# Patient Record
Sex: Female | Born: 1990 | Hispanic: No | Marital: Married | Smoking: Never smoker
Health system: Southern US, Community
[De-identification: ages and names within clinical notes are randomized; demographics above are authoritative.]

## PROBLEM LIST (undated history)

## (undated) DIAGNOSIS — T8859XA Other complications of anesthesia, initial encounter: Secondary | ICD-10-CM

## (undated) DIAGNOSIS — Z789 Other specified health status: Secondary | ICD-10-CM

## (undated) HISTORY — DX: Other complications of anesthesia, initial encounter: T88.59XA

## (undated) HISTORY — PX: NO PAST SURGERIES: SHX2092

---

## 2019-08-31 ENCOUNTER — Encounter: Payer: Self-pay | Admitting: Family Medicine

## 2019-08-31 DIAGNOSIS — Z0289 Encounter for other administrative examinations: Secondary | ICD-10-CM | POA: Insufficient documentation

## 2019-08-31 HISTORY — DX: Encounter for other administrative examinations: Z02.89

## 2019-09-01 ENCOUNTER — Ambulatory Visit (INDEPENDENT_AMBULATORY_CARE_PROVIDER_SITE_OTHER): Payer: Medicaid Other | Admitting: Family Medicine

## 2019-09-01 ENCOUNTER — Other Ambulatory Visit (HOSPITAL_COMMUNITY)
Admission: RE | Admit: 2019-09-01 | Discharge: 2019-09-01 | Disposition: A | Discharge status: Home / Self Care | Payer: Medicaid Other | Source: Ambulatory Visit | Attending: Family Medicine | Admitting: Family Medicine

## 2019-09-01 ENCOUNTER — Other Ambulatory Visit: Payer: Self-pay

## 2019-09-01 VITALS — BP 94/60 | HR 69 | Ht 64.0 in | Wt 124.0 lb

## 2019-09-01 DIAGNOSIS — N926 Irregular menstruation, unspecified: Secondary | ICD-10-CM | POA: Diagnosis not present

## 2019-09-01 DIAGNOSIS — Z0289 Encounter for other administrative examinations: Secondary | ICD-10-CM | POA: Insufficient documentation

## 2019-09-01 DIAGNOSIS — N3001 Acute cystitis with hematuria: Secondary | ICD-10-CM | POA: Diagnosis not present

## 2019-09-01 DIAGNOSIS — K5909 Other constipation: Secondary | ICD-10-CM | POA: Diagnosis not present

## 2019-09-01 LAB — POCT URINALYSIS DIP (MANUAL ENTRY)
Bilirubin, UA: NEGATIVE
Glucose, UA: NEGATIVE mg/dL
Ketones, POC UA: NEGATIVE mg/dL
Nitrite, UA: NEGATIVE
Protein Ur, POC: NEGATIVE mg/dL
Spec Grav, UA: 1.01 (ref 1.010–1.025)
Urobilinogen, UA: 0.2 E.U./dL
pH, UA: 6.5 (ref 5.0–8.0)

## 2019-09-01 LAB — POCT URINE PREGNANCY: Preg Test, Ur: NEGATIVE

## 2019-09-01 LAB — POCT UA - MICROSCOPIC ONLY

## 2019-09-01 MED ORDER — CEPHALEXIN 500 MG PO CAPS: 500.0000 mg | ORAL_CAPSULE | Freq: Three times a day (TID) | ORAL | 0 refills | Status: DC

## 2019-09-01 MED ORDER — POLYETHYLENE GLYCOL 3350 17 G PO PACK: 17.0000 g | PACK | Freq: Every day | ORAL | 3 refills | Status: DC

## 2019-09-01 MED ORDER — PRENATAL 19 29-1 MG PO TABS: ORAL_TABLET | ORAL | 3 refills | Status: DC

## 2019-09-01 NOTE — Assessment & Plan Note (Signed)
Desires another child. Last received Depo in May 2021.  Discussed preconception testing and nutrition. Discussed and recommended PNV. Labs as above today.   Has had a history of irregular menses--- only while on Depo-Provera, suspect this is cause. Less likely thyroid disease or PCOS given normal exam.

## 2019-09-01 NOTE — Patient Instructions (Addendum)
It was wonderful to see you today.  Please bring ALL of your medications with you to every visit.   Today we talked about:  - Starting an antibiotic for a urine infection--please pick this up at your pharmacy today  - Take a prenatal every day   At your next visit we will do a women's exam (pelvic)  This visit is August 16th   Thank you for choosing [facility].   Please call [phone] with any questions about today's appointment.  Please be sure to schedule follow up at the front  desk before you leave today.   Terisa Starr, MD  Family Medicine

## 2019-09-01 NOTE — Progress Notes (Signed)
Patient Name: Kara Monroe Date of Birth: Feb 13, 1991 Date of Visit: 09/01/19 PCP: Westley Chandler, MD  Chief Complaint: refugee intake examination and lower abdominal pain X 2 weeks  The patient's preferred language is Kinyarwanda. An interpreter was used for the entire visit.  Interpreter Name or ID: Zara Chess 3    Subjective: Kara Monroe is a pleasant 29 y.o. presenting today for an initial refugee and immigrant clinic visit.    Lower abdominal pain Patient reports a 1-2 week history of intermittent lower abdominal pain, dysuria, and frequency. Has had intermittent stable vaginal discharge. Reports a remote history of UTI in past. Eating and drinking well. Pain worse with palpation. Not changed by defecation. Reports a history of hard stools. Denies fevers, back pain, vomiting, hematochezia, or melena.   Menses/Contraception Patient reports a history of irregular menses, she attributes to Depo Provera. Had regular menses until she had second child and started Depo after delivery. Desires another child in next 1-2 years. She is planning to stop Depo (last dose in May). No nausea, vomiting. No discharge today. Unsure of her LMP as she has only light spotting most of the time with Depo Provera. No history of excess hair growth, acne, signs or symptoms of thyroid disease.   Ob history: Has had two term vaginal deliveries No issues with bleeding, lacerations, LGA No issues with HTN or diabetes Had one miscarriage after 7 year old daughter around 12-16 weeks   ROS: negative for HA, vision changes, nausea, vomiting, positive for constipation. Negative for fevers, back pain, chest pain, dyspnea.    PMH: Irregular menses  PSH: None   FH: Parents healthy, arriving in [place] next week   Current Medications:  Depo Provera   Refugee Information Number of Immediate Family Members: 4 Number of Immediate Family Members in [place]: 4 Date of Arrival: 08/12/19 Country of Birth:  [facility] Country of Origin:  ([place]) Location of Refugee Camp:  ([place]) Duration in [place]: 16-19 years Reason for Leaving Home Country: Political opinion Primary Language: Kinyarwanda/Rwanda, [place], Swahili/Kiswahili, English Able to Read in Primary Language: Yes Able to Write in Primary Language: Yes (Swahili and [place]) Education: [employer] Marital Status: Married Sexual Activity: Yes Tuberculosis Screening Overseas: Negative Tuberculosis Screening Health Department: Not Completed Health Department Labs Completed: No History of Trauma: None Do You Feel Jumpy or Nervous?: No Are You Very Watchful or 'Super Alert'?: No   Date of Overseas Exam: 04/27/2019 Review of Overseas Exam: Completed, unremarkable Pre-Departure Treatment: Coartem/Albendazole/Praziquantel    Vitals:   09/01/19 0934  BP: 94/60  Pulse: 69  SpO2: 100%   HEENT: Sclera anicteric. Dentition is MOderate . Appears well hydrated. Neck: Supple Cardiac: Regular rate and rhythm. Normal S1/S2. No murmurs, rubs, or gallops appreciated. Lungs: Clear bilaterally to ascultation.  Abdomen: Normoactive bowel sounds. No tenderness to deep or light palpation. No rebound or guarding. Splenomegaly- none, no masses No suprapubic tenderness, no flank pain   Extremities: Warm, well perfused without edema.  Skin: warm, no jaundice   Psych: Pleasant and appropriate  MSK: full ROM of UE and LE    Zweig was seen today for establish care.  Diagnoses and all orders for this visit:  Refugee health examination -     Strongyloides, Ab, IgG -     HCV Ab w Reflex to Quant PCR -     RPR -     HIV Antibody (routine testing w rflx) -     CBC with Differential/Platelet -  Comprehensive metabolic panel -     Varicella zoster antibody, IgG -     Hepatitis B surface antigen -     POCT urinalysis dipstick -     Lipid panel -     TSH -     POCT urine pregnancy -     Urine cytology ancillary only -     POCT UA - Microscopic  Only -     Hgb Fractionation Cascade -     Prenatal Vit-DSS-Fe Fum-FA (PRENATAL 19) 29-1 MG TABS; Take one pill by mouth daily  Acute cystitis with hematuria, suspect this as cause. No signs of pyelonephritis. Consider cervicitis, pelvic with Pap at follow up.  -     cephALEXin (KEFLEX) 500 MG capsule; Take 1 capsule (500 mg total) by mouth 3 (three) times daily. -     Urine Culture  Intermittent constipation -     polyethylene glycol (MIRALAX) 17 g packet; Take 17 g by mouth daily.  Irregular menses Desires another child. Last received Depo in May 2021.  Discussed preconception testing and nutrition. Discussed and recommended PNV. Labs as above today.   Has had a history of irregular menses--- only while on Depo-Provera, suspect this is cause. Less likely thyroid disease or PCOS given normal exam.   Return to care in 1 month in [facility] with resident physician and PCP.

## 2019-09-02 LAB — URINE CYTOLOGY ANCILLARY ONLY
Chlamydia: NEGATIVE
Comment: NEGATIVE
Comment: NORMAL
Neisseria Gonorrhea: NEGATIVE

## 2019-09-03 LAB — HGB FRACTIONATION CASCADE
Hgb A2: 2.9 % (ref 1.8–3.2)
Hgb A: 97.1 % (ref 96.4–98.8)
Hgb F: 0 % (ref 0.0–2.0)
Hgb S: 0 %

## 2019-09-03 LAB — COMPREHENSIVE METABOLIC PANEL
ALT: 13 IU/L (ref 0–32)
AST: 14 IU/L (ref 0–40)
Albumin/Globulin Ratio: 1.8 (ref 1.2–2.2)
Albumin: 4.5 g/dL (ref 3.8–4.8)
Alkaline Phosphatase: 56 IU/L (ref 48–121)
BUN/Creatinine Ratio: 12 (ref 9–23)
BUN: 8 mg/dL (ref 6–20)
Bilirubin Total: 0.5 mg/dL (ref 0.0–1.2)
CO2: 20 mmol/L (ref 20–29)
Calcium: 9.6 mg/dL (ref 8.7–10.2)
Chloride: 103 mmol/L (ref 96–106)
Creatinine, Ser: 0.66 mg/dL (ref 0.57–1.00)
GFR calc Af Amer: 136 mL/min/{1.73_m2} (ref >59)
GFR calc non Af Amer: 118 mL/min/{1.73_m2} (ref >59)
Globulin, Total: 2.5 g/dL (ref 1.5–4.5)
Glucose: 71 mg/dL (ref 65–99)
Potassium: 4.2 mmol/L (ref 3.5–5.2)
Sodium: 140 mmol/L (ref 134–144)
Total Protein: 7 g/dL (ref 6.0–8.5)

## 2019-09-03 LAB — CBC WITH DIFFERENTIAL/PLATELET
Basophils Absolute: 0.1 10*3/uL (ref 0.0–0.2)
Basos: 1 %
EOS (ABSOLUTE): 0.1 10*3/uL (ref 0.0–0.4)
Eos: 2 %
Hematocrit: 41.7 % (ref 34.0–46.6)
Hemoglobin: 13.9 g/dL (ref 11.1–15.9)
Immature Grans (Abs): 0 10*3/uL (ref 0.0–0.1)
Immature Granulocytes: 0 %
Lymphocytes Absolute: 2.4 10*3/uL (ref 0.7–3.1)
Lymphs: 45 %
MCH: 31.3 pg (ref 26.6–33.0)
MCHC: 33.3 g/dL (ref 31.5–35.7)
MCV: 94 fL (ref 79–97)
Monocytes Absolute: 0.5 10*3/uL (ref 0.1–0.9)
Monocytes: 11 %
Neutrophils Absolute: 2.1 10*3/uL (ref 1.4–7.0)
Neutrophils: 41 %
Platelets: 258 10*3/uL (ref 150–450)
RBC: 4.44 x10E6/uL (ref 3.77–5.28)
RDW: 11.9 % (ref 11.7–15.4)
WBC: 5.2 10*3/uL (ref 3.4–10.8)

## 2019-09-03 LAB — RPR: RPR Ser Ql: NONREACTIVE

## 2019-09-03 LAB — VARICELLA ZOSTER ANTIBODY, IGG: Varicella zoster IgG: 670 index (ref >165)

## 2019-09-03 LAB — HEPATITIS B SURFACE ANTIGEN: Hepatitis B Surface Ag: NEGATIVE

## 2019-09-03 LAB — HCV AB W REFLEX TO QUANT PCR: HCV Ab: <0.1 s/co ratio (ref 0.0–0.9)

## 2019-09-03 LAB — LIPID PANEL
Chol/HDL Ratio: 3.3 ratio (ref 0.0–4.4)
Cholesterol, Total: 134 mg/dL (ref 100–199)
HDL: 41 mg/dL (ref >39)
LDL Chol Calc (NIH): 72 mg/dL (ref 0–99)
Triglycerides: 116 mg/dL (ref 0–149)
VLDL Cholesterol Cal: 21 mg/dL (ref 5–40)

## 2019-09-03 LAB — HCV INTERPRETATION

## 2019-09-03 LAB — HIV ANTIBODY (ROUTINE TESTING W REFLEX): HIV Screen 4th Generation wRfx: NONREACTIVE

## 2019-09-03 LAB — TSH: TSH: 5.16 u[IU]/mL — ABNORMAL HIGH (ref 0.450–4.500)

## 2019-09-05 LAB — URINE CULTURE

## 2019-09-06 LAB — STRONGYLOIDES, AB, IGG

## 2019-09-08 ENCOUNTER — Telehealth: Payer: Self-pay | Admitting: Family Medicine

## 2019-09-08 LAB — STRONGYLOIDES, AB, IGG: Strongyloides, Ab, IgG: NEGATIVE

## 2019-09-08 LAB — SPECIMEN STATUS REPORT

## 2019-09-08 NOTE — Telephone Encounter (Signed)
The patient speaks Kara Monroe as their primary language.  An interpreter was used for the entire visit.   Called patient with results. Discussed slightly elevated TSH, recommend repeat with T4 and T3 at follow up. Otherwise, labs normal. She is completing antibiotics.   Strongyloides negative. All questions answered.   Terisa Starr, MD  Family Medicine Teaching Service

## 2019-10-04 ENCOUNTER — Ambulatory Visit (INDEPENDENT_AMBULATORY_CARE_PROVIDER_SITE_OTHER): Payer: Medicaid Other | Admitting: Family Medicine

## 2019-10-04 ENCOUNTER — Other Ambulatory Visit (HOSPITAL_COMMUNITY)
Admission: RE | Admit: 2019-10-04 | Discharge: 2019-10-04 | Disposition: A | Discharge status: Home / Self Care | Payer: Medicaid Other | Source: Ambulatory Visit | Attending: Family Medicine | Admitting: Family Medicine

## 2019-10-04 ENCOUNTER — Other Ambulatory Visit: Payer: Self-pay

## 2019-10-04 ENCOUNTER — Encounter: Payer: Self-pay | Admitting: Family Medicine

## 2019-10-04 VITALS — BP 92/62 | HR 60 | Wt 126.0 lb

## 2019-10-04 DIAGNOSIS — R300 Dysuria: Secondary | ICD-10-CM | POA: Diagnosis not present

## 2019-10-04 DIAGNOSIS — Z124 Encounter for screening for malignant neoplasm of cervix: Secondary | ICD-10-CM | POA: Insufficient documentation

## 2019-10-04 DIAGNOSIS — E039 Hypothyroidism, unspecified: Secondary | ICD-10-CM | POA: Diagnosis present

## 2019-10-04 DIAGNOSIS — N3000 Acute cystitis without hematuria: Secondary | ICD-10-CM

## 2019-10-04 DIAGNOSIS — R1031 Right lower quadrant pain: Secondary | ICD-10-CM | POA: Diagnosis not present

## 2019-10-04 DIAGNOSIS — E038 Other specified hypothyroidism: Secondary | ICD-10-CM

## 2019-10-04 LAB — POCT WET PREP (WET MOUNT)
Clue Cells Wet Prep Whiff POC: NEGATIVE
Trichomonas Wet Prep HPF POC: ABSENT

## 2019-10-04 LAB — POCT URINALYSIS DIP (MANUAL ENTRY)
Bilirubin, UA: NEGATIVE
Glucose, UA: NEGATIVE mg/dL
Ketones, POC UA: NEGATIVE mg/dL
Nitrite, UA: NEGATIVE
Protein Ur, POC: NEGATIVE mg/dL
Spec Grav, UA: 1.015 (ref 1.010–1.025)
Urobilinogen, UA: 0.2 E.U./dL
pH, UA: 8.5 — AB (ref 5.0–8.0)

## 2019-10-04 LAB — POCT UA - MICROSCOPIC ONLY: WBC, Ur, HPF, POC: >20

## 2019-10-04 LAB — POCT URINE PREGNANCY: Preg Test, Ur: NEGATIVE

## 2019-10-04 MED ORDER — CEPHALEXIN 500 MG PO CAPS: 500.0000 mg | ORAL_CAPSULE | Freq: Four times a day (QID) | ORAL | 0 refills | Status: DC

## 2019-10-04 NOTE — Patient Instructions (Addendum)
It was wonderful to see you today.  Please bring ALL of your medications with you to every visit.   Today we talked about:  - Your pregnancy test is negative  - I think you may have an infection---I would like to do some blood work   - I will call you with results of your urine and blood testing  - I recommend taking a prenatal    Follow up in 4-6 months    Thank you for choosing [facility].   Please call [phone] with any questions about today's appointment.  Please be sure to schedule follow up at the front  desk before you leave today.   Terisa Starr, MD  Family Medicine

## 2019-10-04 NOTE — Progress Notes (Signed)
° ° °  SUBJECTIVE:   CHIEF COMPLAINT / HPI:   The patient speaks Kinyarwanda as their primary language.  An interpreter was used for the entire visit.   Kara Monroe is a 29 year old presenting with suprapubic pain and follow up.  Lab Review Notable for mild elevation in TSH. Denies signs or symptoms of thyroid deficiency. Amenable to repeat.   Recent UTI Patient reports cephalexin was completed and she had improvement in urinary symptoms. Last Tuesday, pain began again with associated spotting. Pain is intermittent, worse with urination, located in suprapubic and RLQ area. No nausea, vomiting, fevers. She denies back pain. Endorses poorer appetite. She denies prior recurrent UTI in past---only since coming to [place] has she had recurrence.    PERTINENT  PMH / PSH/Family/Social History : Reviewed and updated   OBJECTIVE:   BP 92/62    Pulse 60    Wt 126 lb (57.2 kg)    SpO2 98%    BMI 21.63 kg/m   HEENT: Sclera anicteric. Dentition is moderate. Appears well hydrated. Neck: Supple Cardiac: Regular rate and rhythm. Normal S1/S2. No murmurs, rubs, or gallops appreciated. Lungs: Clear bilaterally to ascultation.  Abdomen: Normoactive bowel sounds. Mild suprapubic and RLQ tenderness GU Exam:  Chaperoned exam.  External exam: Normal-appearing female external genitalia.  Vaginal exam notable for moderate discharge.  Cervix with + transformation zone  Bimanual exam reveals normal sized uterus no masses appreciated, no pain with examination, no RLQ pain  Extremities: Warm, well perfused without edema.  Skin: Warm, dry Psych: Pleasant and appropriate    ASSESSMENT/PLAN:   Suprapubic pain, most concerning for UTI, also considered appendicitis (eating well, less likely, will obtain labs as below), pyelonephritis (no CVA tenderness, less likely), no RLQ pain on pelvic making torsion less likely. Ovarian cyst is also possible. Send for urine culture, will empirically treat, at follow up  discuss ways to prevent UTI. CBC with differential and CMP to assess for intra-abdominal pathology given recurrence and pain on initial exam.   Subclinical Hypothyroidism  TSH, T4, T3 today   HCM Pap performed today   Terisa Starr, MD  Family Medicine Teaching Service  [facility]

## 2019-10-05 LAB — COMPREHENSIVE METABOLIC PANEL
ALT: 21 IU/L (ref 0–32)
AST: 27 IU/L (ref 0–40)
Albumin/Globulin Ratio: 1.6 (ref 1.2–2.2)
Albumin: 4.2 g/dL (ref 3.8–4.8)
Alkaline Phosphatase: 59 IU/L (ref 48–121)
BUN/Creatinine Ratio: 11 (ref 9–23)
BUN: 5 mg/dL — ABNORMAL LOW (ref 6–20)
Bilirubin Total: 0.5 mg/dL (ref 0.0–1.2)
CO2: 19 mmol/L — ABNORMAL LOW (ref 20–29)
Calcium: 9.3 mg/dL (ref 8.7–10.2)
Chloride: 106 mmol/L (ref 96–106)
Creatinine, Ser: 0.47 mg/dL — ABNORMAL LOW (ref 0.57–1.00)
GFR calc Af Amer: 152 mL/min/{1.73_m2} (ref >59)
GFR calc non Af Amer: 132 mL/min/{1.73_m2} (ref >59)
Globulin, Total: 2.6 g/dL (ref 1.5–4.5)
Glucose: 73 mg/dL (ref 65–99)
Potassium: 4 mmol/L (ref 3.5–5.2)
Sodium: 138 mmol/L (ref 134–144)
Total Protein: 6.8 g/dL (ref 6.0–8.5)

## 2019-10-05 LAB — CBC WITH DIFFERENTIAL/PLATELET
Basophils Absolute: 0 10*3/uL (ref 0.0–0.2)
Basos: 1 %
EOS (ABSOLUTE): 0.1 10*3/uL (ref 0.0–0.4)
Eos: 2 %
Hematocrit: 37.2 % (ref 34.0–46.6)
Hemoglobin: 12.5 g/dL (ref 11.1–15.9)
Immature Grans (Abs): 0 10*3/uL (ref 0.0–0.1)
Immature Granulocytes: 0 %
Lymphocytes Absolute: 1.7 10*3/uL (ref 0.7–3.1)
Lymphs: 36 %
MCH: 31.1 pg (ref 26.6–33.0)
MCHC: 33.6 g/dL (ref 31.5–35.7)
MCV: 93 fL (ref 79–97)
Monocytes Absolute: 0.5 10*3/uL (ref 0.1–0.9)
Monocytes: 9 %
Neutrophils Absolute: 2.5 10*3/uL (ref 1.4–7.0)
Neutrophils: 52 %
Platelets: 216 10*3/uL (ref 150–450)
RBC: 4.02 x10E6/uL (ref 3.77–5.28)
RDW: 12.7 % (ref 11.7–15.4)
WBC: 4.8 10*3/uL (ref 3.4–10.8)

## 2019-10-05 LAB — CYTOLOGY - PAP
Chlamydia: NEGATIVE
Comment: NEGATIVE
Comment: NEGATIVE
Comment: NORMAL
Diagnosis: NEGATIVE
High risk HPV: NEGATIVE
Neisseria Gonorrhea: NEGATIVE

## 2019-10-05 LAB — T4, FREE: Free T4: 1.09 ng/dL (ref 0.82–1.77)

## 2019-10-05 LAB — T3: T3, Total: 109 ng/dL (ref 71–180)

## 2019-10-05 LAB — TSH: TSH: 4.36 u[IU]/mL (ref 0.450–4.500)

## 2019-10-06 ENCOUNTER — Telehealth: Payer: Self-pay | Admitting: Family Medicine

## 2019-10-06 LAB — URINE CULTURE

## 2019-10-06 NOTE — Telephone Encounter (Signed)
Called patient with [facility] [place] interpreter.  Urine culture returned- negative Pap smear was normal- repeat 5 years, discussed.   All other labs normal, no indication for infection. Differential still includes ovarian cyst. She reports she feels better. No need to fill medication. All questions.   Terisa Starr, MD  Family Medicine Teaching Service

## 2019-11-12 ENCOUNTER — Ambulatory Visit (INDEPENDENT_AMBULATORY_CARE_PROVIDER_SITE_OTHER): Payer: Medicaid Other | Admitting: Family Medicine

## 2019-11-12 ENCOUNTER — Other Ambulatory Visit: Payer: Self-pay

## 2019-11-12 ENCOUNTER — Encounter: Payer: Self-pay | Admitting: Family Medicine

## 2019-11-12 VITALS — BP 105/78 | Wt 124.0 lb

## 2019-11-12 DIAGNOSIS — G8929 Other chronic pain: Secondary | ICD-10-CM | POA: Diagnosis not present

## 2019-11-12 DIAGNOSIS — R102 Pelvic and perineal pain: Secondary | ICD-10-CM

## 2019-11-12 DIAGNOSIS — G894 Chronic pain syndrome: Secondary | ICD-10-CM

## 2019-11-12 LAB — POCT URINE PREGNANCY: Preg Test, Ur: NEGATIVE

## 2019-11-12 NOTE — Assessment & Plan Note (Addendum)
Differential includes ovarian cyst, leiomyoma, painful bladder syndrome. Although symptoms consistent with UTI, prior cultures negative. Will repeat today. Unlikely to be nephrolithiasis. Also consider Schistosoma given pyuria, negative culture. Trichomonas also possible (negative testing previously). GC/CT also negative.   Review of prior cultures--- one with E. Coli, treated. Follow up culture negative. Consider urine micro at follow up--had pyuria on last evaluation with negative culture (sterile pyuria, although some epithelial cells).   Plan today: - Repeat culture, obtain microscopy at follow up - Pelvic ultrasound to evaluate for cyst/leiomyoma

## 2019-11-12 NOTE — Progress Notes (Signed)
    SUBJECTIVE:   CHIEF COMPLAINT / HPI:   The patient speaks Kinyarwanda as their primary language.  An interpreter was used for the entire visit.   Kara Monroe is a pleasatn 29 year old woman with history significant for chronic pelvic pain. This started prior to arrival in [place] but now has occurred every month. Typically occurs in week leading up to menses. Often right sided, associated with dysuria and suprapubic pain. No frequency, urgency, hesitation, or hematuria. She is sexually active with her husband. She is not using contraception as she desires another child. LMP 8/24.   The patient is not working currently. She has 2 healthy children. Her and her husband are doing well. She wishes to wait to receive her vaccine for influenza with [facility].   PERTINENT  PMH / PSH/Family/Social History : Updated and reviewed  OBJECTIVE:   BP 105/78 (BP Location: Left Arm)   Wt 124 lb (56.2 kg)   BMI 21.28 kg/m   HEENT: Sclera anicteric. Dentition is moderate. Appears well hydrated. Neck: Supple Cardiac: Regular rate and rhythm. Normal S1/S2. No murmurs, rubs, or gallops appreciated. Lungs: Clear bilaterally to ascultation.  Abdomen: Normoactive bowel sounds. No tenderness to deep or light palpation. No rebound or guarding.  Extremities: Warm, well perfused without edema.  Skin: Warm, dry Psych: Pleasant and appropriate    ASSESSMENT/PLAN:   Chronic pelvic pain in female Differential includes ovarian cyst, leiomyoma, painful bladder syndrome. Although symptoms consistent with UTI, prior cultures negative. Will repeat today. Unlikely to be nephrolithiasis. Also consider Schistosoma given pyuria, negative culture. Trichomonas also possible (negative testing previously). GC/CT also negative.   Review of prior cultures--- one with E. Coli, treated. Follow up culture negative. Consider urine micro at follow up--had pyuria on last evaluation with negative culture (sterile pyuria,  although some epithelial cells).   Plan today: - Repeat culture, obtain microscopy at follow up - Pelvic ultrasound to evaluate for cyst/leiomyoma     HCM - Will receive influenza at [facility]   Terisa Starr, MD  Family Medicine Teaching Service  [facility]

## 2019-11-12 NOTE — Patient Instructions (Addendum)
It was wonderful to see you today.  Please bring ALL of your medications with you to every visit.    - Checking an ultrasound for your pain    Thank you for choosing [facility].   Please call [phone] with any questions about today's appointment.  Please be sure to schedule follow up at the front  desk before you leave today.   Terisa Starr, MD  Family Medicine

## 2019-11-15 LAB — URINE CULTURE

## 2019-11-17 ENCOUNTER — Encounter: Payer: Self-pay | Admitting: Family Medicine

## 2019-11-18 ENCOUNTER — Ambulatory Visit: Admission: RE | Admit: 2019-11-18 | Payer: Medicaid Other | Source: Ambulatory Visit

## 2019-12-01 ENCOUNTER — Inpatient Hospital Stay: Admission: RE | Admit: 2019-12-01 | Payer: Medicaid Other | Source: Ambulatory Visit

## 2019-12-15 ENCOUNTER — Inpatient Hospital Stay: Admission: RE | Admit: 2019-12-15 | Payer: Medicaid Other | Source: Ambulatory Visit

## 2019-12-27 ENCOUNTER — Ambulatory Visit
Admission: RE | Admit: 2019-12-27 | Discharge: 2019-12-27 | Disposition: A | Discharge status: Home / Self Care | Payer: Medicaid Other | Source: Ambulatory Visit | Attending: Family Medicine | Admitting: Family Medicine

## 2019-12-27 ENCOUNTER — Ambulatory Visit: Payer: Medicaid Other | Admitting: Family Medicine

## 2019-12-27 ENCOUNTER — Other Ambulatory Visit: Payer: Self-pay

## 2019-12-27 DIAGNOSIS — G894 Chronic pain syndrome: Secondary | ICD-10-CM | POA: Diagnosis not present

## 2019-12-28 ENCOUNTER — Other Ambulatory Visit: Payer: Medicaid Other

## 2019-12-29 ENCOUNTER — Encounter: Payer: Self-pay | Admitting: Family Medicine

## 2019-12-29 ENCOUNTER — Telehealth: Payer: Self-pay | Admitting: Family Medicine

## 2019-12-29 NOTE — Telephone Encounter (Signed)
Called patient with [facility] interpreter.   Left generic voicemail to call back. She needs repeat ultrasound in 6-12 weeks. Will also send letter.  Terisa Starr, MD  Family Medicine Teaching Service

## 2020-01-05 ENCOUNTER — Telehealth: Payer: Self-pay | Admitting: Family Medicine

## 2020-01-05 NOTE — Telephone Encounter (Signed)
The patient speaks [name] as their primary language.  An interpreter was used for the entire visit.   Left generic voicemail to call back---if patient calls back please schedule with me in clinic in next 1-2 months.  Terisa Starr, MD  Family Medicine Teaching Service

## 2020-02-19 NOTE — L&D Delivery Note (Signed)
Obstetrical Delivery Note   Date of Delivery:   01/12/2021 Primary OB:   [facility] Gestational Age/EDD: [redacted]w[redacted]d Reason for Admission: Active labor Antepartum complications: none  Delivered By:   Cornelia Copa. MD  Delivery Type:   spontaneous vaginal delivery  Delivery Details:   I was called to see patient for BBOW. On my arrival, patient was +3 with BBOW that was ruptured with moderate meconium. With the second contraction, patient easily delivered baby. Loose nuchal x 1 reduced prior to delivery. Anesthesia:    epidural Intrapartum complications: None GBS:    Negative Laceration:    1st degree (repaired with 3-0 vicryl) Episiotomy:    none Rectal exam:   deferred Placenta:    Delivered and expressed via active management. Intact: yes. To pathology: no.  Delayed Cord Clamping: yes Estimated Blood Loss:   Baby:    Liveborn female, APGARs 9/9, weight 3460gm  Cornelia Copa. MD Attending Center for [facility])

## 2020-04-19 ENCOUNTER — Ambulatory Visit (HOSPITAL_COMMUNITY)
Admission: EM | Admit: 2020-04-19 | Discharge: 2020-04-19 | Disposition: A | Discharge status: Home / Self Care | Payer: Medicaid Other | Attending: Family Medicine | Admitting: Family Medicine

## 2020-04-19 ENCOUNTER — Encounter (HOSPITAL_COMMUNITY): Payer: Self-pay

## 2020-04-19 ENCOUNTER — Other Ambulatory Visit: Payer: Self-pay

## 2020-04-19 DIAGNOSIS — N760 Acute vaginitis: Secondary | ICD-10-CM

## 2020-04-19 DIAGNOSIS — N898 Other specified noninflammatory disorders of vagina: Secondary | ICD-10-CM

## 2020-04-19 LAB — POCT URINALYSIS DIPSTICK, ED / UC
Bilirubin Urine: NEGATIVE
Glucose, UA: NEGATIVE mg/dL
Leukocytes,Ua: NEGATIVE
Nitrite: NEGATIVE
Protein, ur: NEGATIVE mg/dL
Specific Gravity, Urine: 1.025 (ref 1.005–1.030)
Urobilinogen, UA: 0.2 mg/dL (ref 0.0–1.0)
pH: 5.5 (ref 5.0–8.0)

## 2020-04-19 NOTE — ED Triage Notes (Signed)
Pt in with c/o vaginal irritation and discharge that has been going on for 2 weeks now  Pt has not had medication for sxs

## 2020-04-19 NOTE — ED Provider Notes (Signed)
[facility]    CSN: [id number] Arrival date & time: 04/19/20  1008      History   Chief Complaint Chief Complaint  Patient presents with  . Vaginal Discharge    HPI Kara Monroe is a 30 y.o. female.   Medical interpreter used today to facilitate visit with patient's consent.  She is complaining of about 2 weeks of vaginal discharge itching and irritation as well as some mild suprapubic tenderness.  Denies fever, chills, nausea, vomiting, diarrhea, pelvic pain, hematuria, dysuria, urinary frequency.  States she was treated very recently for a vaginal infection with mild temporary relief but symptoms returned about 2 days after treatment.  She does not remember what she was treated for or with.  These records are not available.  She denies any new sexual partners, or concern for STDs.  LMP was 04/07/2020.  Has not been trying anything over-the-counter for symptoms.     History reviewed. No pertinent past medical history.  There are no problems to display for this patient.   History reviewed. No pertinent surgical history.  OB History   No obstetric history on file.      Home Medications    Prior to Admission medications   Not on File    Family History History reviewed. No pertinent family history.  Social History Social History   Tobacco Use  . Smoking status: Never Smoker  . Smokeless tobacco: Never Used  Substance Use Topics  . Drug use: Never     Allergies   Patient has no known allergies.   Review of Systems Review of Systems Per HPI  Physical Exam Triage Vital Signs ED Triage Vitals  Enc Vitals Group     BP 04/19/20 1043 97/69     Pulse Rate 04/19/20 1043 78     Resp 04/19/20 1043 17     Temp 04/19/20 1043 98.6 F (37 C)     Temp src --      SpO2 04/19/20 1043 98 %     Weight --      Height --      Head Circumference --      Peak Flow --      Pain Score 04/19/20 1047 0     Pain Loc --      Pain Edu? --      Excl. in  GC? --    No data found.  Updated Vital Signs BP 97/69   Pulse 78   Temp 98.6 F (37 C)   Resp 17   LMP 04/07/2020 (Exact Date)   SpO2 98%   Visual Acuity Right Eye Distance:   Left Eye Distance:   Bilateral Distance:    Right Eye Near:   Left Eye Near:    Bilateral Near:     Physical Exam Vitals and nursing note reviewed.  Constitutional:      Appearance: Normal appearance. She is not ill-appearing.  HENT:     Head: Atraumatic.     Mouth/Throat:     Mouth: Mucous membranes are moist.     Pharynx: Oropharynx is clear.  Eyes:     Extraocular Movements: Extraocular movements intact.     Conjunctiva/sclera: Conjunctivae normal.  Cardiovascular:     Rate and Rhythm: Normal rate and regular rhythm.     Heart sounds: Normal heart sounds.  Pulmonary:     Effort: Pulmonary effort is normal.     Breath sounds: Normal breath sounds.  Abdominal:     General:  Bowel sounds are normal. There is no distension.     Palpations: Abdomen is soft.     Tenderness: There is abdominal tenderness. There is no right CVA tenderness, left CVA tenderness or guarding.     Comments: Mild diffuse lower abdominal tenderness to palpation  Musculoskeletal:        General: Normal range of motion.     Cervical back: Normal range of motion and neck supple.  Skin:    General: Skin is warm and dry.  Neurological:     Mental Status: She is alert and oriented to person, place, and time.  Psychiatric:        Mood and Affect: Mood normal.        Thought Content: Thought content normal.        Judgment: Judgment normal.      UC Treatments / Results  Labs (all labs ordered are listed, but only abnormal results are displayed) Labs Reviewed  POCT URINALYSIS DIPSTICK, ED / UC - Abnormal; Notable for the following components:      Result Value   Ketones, ur TRACE (*)    Hgb urine dipstick TRACE (*)    All other components within normal limits  CERVICOVAGINAL ANCILLARY ONLY     EKG   Radiology No results found.  Procedures Procedures (including critical care time)  Medications Ordered in UC Medications - No data to display  Initial Impression / Assessment and Plan / UC Course  I have reviewed the triage vital signs and the nursing notes.  Pertinent labs & imaging results that were available during my care of the patient were reviewed by me and considered in my medical decision making (see chart for details).     Vitals and exam reassuring, UA benign overall with just trace hemoglobin today, vaginal swab pending.  Discussed with patient that we would treat based on what her vaginal swab results look like.  Good vaginal hygiene reviewed.  She declines STD screening today.   Final Clinical Impressions(s) / UC Diagnoses   Final diagnoses:  Acute vaginitis   Discharge Instructions   None    ED Prescriptions    None     PDMP not reviewed this encounter.   Particia Nearing, [place] 04/19/20 1146

## 2020-04-20 ENCOUNTER — Encounter: Payer: Self-pay | Admitting: Family Medicine

## 2020-04-20 ENCOUNTER — Telehealth (HOSPITAL_COMMUNITY): Payer: Self-pay | Admitting: Emergency Medicine

## 2020-04-20 LAB — CERVICOVAGINAL ANCILLARY ONLY
Bacterial Vaginitis (gardnerella): NEGATIVE
Candida Glabrata: NEGATIVE
Candida Vaginitis: POSITIVE — AB
Comment: NEGATIVE
Comment: NEGATIVE
Comment: NEGATIVE

## 2020-04-20 MED ORDER — FLUCONAZOLE 150 MG PO TABS: 150.0000 mg | ORAL_TABLET | Freq: Once | ORAL | 0 refills | Status: AC

## 2020-04-24 ENCOUNTER — Telehealth: Payer: Self-pay | Admitting: Family Medicine

## 2020-04-24 DIAGNOSIS — N83209 Unspecified ovarian cyst, unspecified side: Secondary | ICD-10-CM

## 2020-04-24 NOTE — Telephone Encounter (Addendum)
Seen today with her daughter Rushie Goltz.  Interpreter used throughout encounter.  The patient has a prior ultrasound demonstrating ovarian cysts.  This requires follow-up.  Discussed with the patient.  She is amenable to this.  Ultrasound has been ordered.  She is available Tuesday, March 8 but then starts work on Wednesday and does not know her schedule thereafter.   Nursing- can you please call patient and help to schedule ultrasound? Radiology did not have availability tomorrow when I called today.   Terisa Starr, MD  Family Medicine Teaching Service

## 2020-04-24 NOTE — Telephone Encounter (Signed)
Called patient using [employer] ID# [id number].  Informed patient of appointment:  Ultrasound Pelvic Complete [facility] 05/03/2020 [address] [phone] appointment and arrival at 1500 Please drink 32 ounces of water 1 hour prior to appointment for a full bladder for appointment.  Do not void  Wear mask and come alone.  Glennie Hawk, CMA

## 2020-05-03 ENCOUNTER — Ambulatory Visit
Admission: RE | Admit: 2020-05-03 | Discharge: 2020-05-03 | Disposition: A | Discharge status: Home / Self Care | Payer: Medicaid Other | Source: Ambulatory Visit | Attending: Family Medicine | Admitting: Family Medicine

## 2020-05-03 DIAGNOSIS — N83209 Unspecified ovarian cyst, unspecified side: Secondary | ICD-10-CM

## 2020-05-05 ENCOUNTER — Telehealth: Payer: Self-pay | Admitting: Family Medicine

## 2020-05-05 NOTE — Telephone Encounter (Signed)
Called with interpreter and reviewed results.  Terisa Starr, MD  Family Medicine Teaching Service

## 2020-06-13 NOTE — Progress Notes (Signed)
   Subjective:   Patient ID: Kara Monroe    DOB: 02/09/1991, 30 y.o. female   MRN: [id number]  Kara Monroe is a 30 y.o. female with a history of chronic pelvic pain, irregular menses, refugee here for pregnancy test  Missed Period: Patient presents to confirm prengancy.  Endorses LMP: 04/16/20 Regular: Since August she endorsed regular periods.History of irregular periods when on Depo. Last received in August per patient. Birth control in last 3 months: No Home pregnancy test: none taken This was planned and wanted Prenatal Vitamin: None  OB history: [id number] - 2 full term vaginal deliveries then 1 ectopic pregnancy medically treated with medication at 4-8 weeks. Prior pregnancies were in [place]. H/o c-section: None Pregnancy complications: None Delivery complications:None Tobacco: None Alcohol: None Illicit drugs: None Medications: None  Denies any prior pelvic surgeries.  Review of Systems:  Per HPI.   Objective:   BP 118/64   Pulse 72   Ht 5' 3.78" (1.62 m)   Wt 130 lb 3.2 oz (59.1 kg)   LMP 04/11/2020   SpO2 99%   BMI 22.50 kg/m  Vitals and nursing note reviewed.  General: pleasant young female, sitting comfortably in exam chair, well nourished, well developed, in no acute distress with non-toxic appearance Resp: breathing comfortably on room air, speaking in full sentences Skin: warm, dry MSK: gait normal Neuro: Alert and oriented, speech normal  Assessment & Plan:   Positive pregnancy test Urine pregnancy positive. LMP 04/16/20 which makes her [redacted]w[redacted]d with EDD of 01/21/21. Final due date will be determined by dating ultrasound.  This was planned and wanted. History of 3 pregnancies, 2 live births, 1 ectopic pregnancy. All vaginal. No history of GDM or gHTN.   Scheduled for dating ultrasound  Initial OB labs obtained today  Start prenatal vitamin  Provided list of medications safe to take during pregnancy  Encouraged to avoid alcohol,  tobacco/nicotine, and illicit drugs  Avoid cat litter, seafood, unpasteurized soft cheeses, and deli meats  Patient to make initial OB appointment for ~2 weeks  Med reconciliation completed and ensured that patient is not taking any medications that would be harmful to her the fetus  Given that she does not yet have a confirmed intrauterine pregnancy, advised her to go to the ED immediately if she has vaginal bleeding and pain  Discussed use of saltines and ginger for nausea  Orders Placed This Encounter  Procedures  . Urine Culture  . US OB LESS THAN 14 WEEKS W/ OB TRANSVAGINAL AND DOPPLER    Standing Status:   Future    Standing Expiration Date:   06/14/2021    Order Specific Question:   Reason for Exam (SYMPTOM  OR DIAGNOSIS REQUIRED)    Answer:   dating ultrasound    Order Specific Question:   Preferred Imaging Location?    Answer:   [facility]  . Obstetric Panel, Including HIV([facility])  . Hgb Fractionation Cascade  . Hepatitis C antibody (reflex, frozen specimen)  . POCT urine pregnancy   Meds ordered this encounter  Medications  . Prenatal Vit-DSS-Fe Fum-FA (PRENATAL 19) 29-1 MG TABS    Sig: Take one pill by mouth daily    Dispense:  90 tablet    Refill:  3      Orpah Cobb, DO PGY-3, [facility] 06/14/2020 3:58 PM

## 2020-06-14 ENCOUNTER — Encounter: Payer: Self-pay | Admitting: Family Medicine

## 2020-06-14 ENCOUNTER — Other Ambulatory Visit: Payer: Self-pay

## 2020-06-14 ENCOUNTER — Ambulatory Visit: Payer: Medicaid Other | Admitting: Family Medicine

## 2020-06-14 VITALS — BP 118/64 | HR 72 | Ht 63.78 in | Wt 130.2 lb

## 2020-06-14 DIAGNOSIS — Z3201 Encounter for pregnancy test, result positive: Secondary | ICD-10-CM | POA: Diagnosis present

## 2020-06-14 LAB — POCT URINE PREGNANCY: Preg Test, Ur: POSITIVE — AB

## 2020-06-14 MED ORDER — PRENATAL 19 29-1 MG PO TABS: ORAL_TABLET | ORAL | 3 refills | Status: DC

## 2020-06-14 NOTE — Patient Instructions (Addendum)
We have you scheduled for a dating ultrasound for May 19th at 2:00. Please arrive to this appointment by 1:45 with a full bladder.   Please start prenatal vitamin as soon as possible  Avoid alcohol, nicotine, and illicit drugs  Avoid cat litter, seafood, unpasteurized soft cheeses, and deli meats  Please make your initial OB appointment on your way out for 2 weeks  IF you develop vaginal bleeding or significant pelvic pain please report to the emergency department immediately  Safe Medications in Pregnancy   Acne: Benzoyl Peroxide Salicylic Acid  Backache/Headache: Tylenol: 2 regular strength every 4 hours OR              2 Extra strength every 6 hours  Colds/Coughs/Allergies: Benadryl (alcohol free) 25 mg every 6 hours as needed Breath right strips Claritin Cepacol throat lozenges Chloraseptic throat spray Cold-Eeze- up to three times per day Cough drops, alcohol free Flonase (by prescription only) Guaifenesin Mucinex Robitussin DM (plain only, alcohol free) Saline nasal spray/drops Sudafed (pseudoephedrine) & Actifed ** use only after [redacted] weeks gestation and if you do not have high blood pressure Tylenol Vicks Vaporub Zinc lozenges Zyrtec   Constipation: Colace Ducolax suppositories Fleet enema Glycerin suppositories Metamucil Milk of magnesia Miralax Senokot Smooth move tea  Diarrhea: Kaopectate Imodium A-D  *NO pepto Bismol  Hemorrhoids: Anusol Anusol HC Preparation H Tucks  Indigestion: Tums Maalox Mylanta Zantac  Pepcid  Insomnia: Benadryl (alcohol free) 25mg  every 6 hours as needed Tylenol PM Unisom, no Gelcaps  Leg Cramps: Tums MagGel  Nausea/Vomiting:  Bonine Dramamine Emetrol Ginger extract Sea bands Meclizine  Nausea medication to take during pregnancy:  Unisom (doxylamine succinate 25 mg tablets) Take one tablet daily at bedtime. If symptoms are not adequately controlled, the dose can be increased to a maximum  recommended dose of two tablets daily (1/2 tablet in the morning, 1/2 tablet mid-afternoon and one at bedtime). Vitamin B6 100mg  tablets. Take one tablet twice a day (up to 200 mg per day).  Skin Rashes: Aveeno products Benadryl cream or 25mg  every 6 hours as needed Calamine Lotion 1% cortisone cream  Yeast infection: Gyne-lotrimin 7 Monistat 7   **If taking multiple medications, please check labels to avoid duplicating the same active ingredients **take medication as directed on the label ** Do not exceed 4000 mg of tylenol in 24 hours **Do not take medications that contain aspirin or ibuprofen

## 2020-06-14 NOTE — Assessment & Plan Note (Signed)
Urine pregnancy positive. LMP 04/16/20 which makes her [redacted]w[redacted]d with EDD of 01/21/21. Final due date will be determined by dating ultrasound.  This was planned and wanted. History of 3 pregnancies, 2 live births, 1 ectopic pregnancy. All vaginal. No history of GDM or gHTN.   Scheduled for dating ultrasound  Initial OB labs obtained today  Start prenatal vitamin  Provided list of medications safe to take during pregnancy  Encouraged to avoid alcohol, tobacco/nicotine, and illicit drugs  Avoid cat litter, seafood, unpasteurized soft cheeses, and deli meats  Patient to make initial OB appointment for ~2 weeks  Med reconciliation completed and ensured that patient is not taking any medications that would be harmful to her the fetus  Given that she does not yet have a confirmed intrauterine pregnancy, advised her to go to the ED immediately if she has vaginal bleeding and pain  Discussed use of saltines and ginger for nausea

## 2020-06-16 LAB — HGB FRACTIONATION CASCADE
Hgb A2: 2.9 % (ref 1.8–3.2)
Hgb A: 97.1 % (ref 96.4–98.8)
Hgb F: 0 % (ref 0.0–2.0)
Hgb S: 0 %

## 2020-06-16 LAB — OBSTETRIC PANEL, INCLUDING HIV
Antibody Screen: NEGATIVE
Basophils Absolute: 0 10*3/uL (ref 0.0–0.2)
Basos: 0 %
EOS (ABSOLUTE): 0.1 10*3/uL (ref 0.0–0.4)
Eos: 3 %
HIV Screen 4th Generation wRfx: NONREACTIVE
Hematocrit: 34.1 % (ref 34.0–46.6)
Hemoglobin: 11.4 g/dL (ref 11.1–15.9)
Hepatitis B Surface Ag: NEGATIVE
Immature Grans (Abs): 0 10*3/uL (ref 0.0–0.1)
Immature Granulocytes: 0 %
Lymphocytes Absolute: 1.9 10*3/uL (ref 0.7–3.1)
Lymphs: 36 %
MCH: 30.7 pg (ref 26.6–33.0)
MCHC: 33.4 g/dL (ref 31.5–35.7)
MCV: 92 fL (ref 79–97)
Monocytes Absolute: 0.6 10*3/uL (ref 0.1–0.9)
Monocytes: 12 %
Neutrophils Absolute: 2.6 10*3/uL (ref 1.4–7.0)
Neutrophils: 49 %
Platelets: 253 10*3/uL (ref 150–450)
RBC: 3.71 x10E6/uL — ABNORMAL LOW (ref 3.77–5.28)
RDW: 13 % (ref 11.7–15.4)
RPR Ser Ql: NONREACTIVE
Rh Factor: POSITIVE
Rubella Antibodies, IGG: 15.1 index (ref >0.99)
WBC: 5.2 10*3/uL (ref 3.4–10.8)

## 2020-06-16 LAB — HCV AB W REFLEX TO QUANT PCR: HCV Ab: <0.1 s/co ratio (ref 0.0–0.9)

## 2020-06-16 LAB — HCV INTERPRETATION

## 2020-06-17 LAB — URINE CULTURE: Organism ID, Bacteria: NO GROWTH

## 2020-07-06 ENCOUNTER — Other Ambulatory Visit: Payer: Self-pay

## 2020-07-06 ENCOUNTER — Other Ambulatory Visit: Payer: Self-pay | Admitting: Family Medicine

## 2020-07-06 ENCOUNTER — Ambulatory Visit
Admission: RE | Admit: 2020-07-06 | Discharge: 2020-07-06 | Disposition: A | Discharge status: Home / Self Care | Payer: Medicaid Other | Source: Ambulatory Visit | Attending: Family Medicine | Admitting: Family Medicine

## 2020-07-06 DIAGNOSIS — Z3201 Encounter for pregnancy test, result positive: Secondary | ICD-10-CM | POA: Insufficient documentation

## 2020-07-09 ENCOUNTER — Encounter: Payer: Self-pay | Admitting: Family Medicine

## 2020-07-09 DIAGNOSIS — N9489 Other specified conditions associated with female genital organs and menstrual cycle: Secondary | ICD-10-CM | POA: Insufficient documentation

## 2020-07-09 NOTE — Progress Notes (Signed)
Attempted to contact patient to discuss [place] results using [place] interpreter. LMoVM. Patient did not schedule initial OB visit. Scheduled her for 07/11/20 at 1:50pm on ATC as no other provider was available for initial OB visit. Recommend discussing ultrasound at time of visit.

## 2020-07-11 NOTE — Progress Notes (Deleted)
Patient Name: Markella Dao Date of Birth: February 08, 1991 [facility] Initial Prenatal Visit  Makenah Karas is a 30 y.o. year old G3P2010 at Unknown who presents for her initial prenatal visit. Pregnancy {Is/is not:9024} planned She reports {pregnancy symptoms:18128}. She {is/is not:320031::"is"} taking a prenatal vitamin.  She denies pelvic pain or vaginal bleeding.   Pregnancy Dating: . The patient is dated by ***.  . LMP: *** . Period is certain:  {yes/no:20286}.  Marland Kitchen Periods were regular:  {yes/no:20286}.  Marland Kitchen LMP was a typical period:  {yes/no:20286}.  Marland Kitchen Using hormonal contraception in 3 months prior to conception: {yes/no:20286}  Lab Review: . Blood type: O . Rh Status: {Rh status :23298::"+"} . Antibody screen: {NEGATIVE/POSITIVE FOR:19998::"Negative"} . HIV: {NEGATIVE/POSITIVE FOR:19998::"Negative"} . RPR: {NEGATIVE/APPRO/POSITIVE FOR:20006::"Negative"} . Hemoglobin electrophoresis reviewed: {yes/no:20286::"Yes"} . Results of OB urine culture are: {NEGATIVE/POSITIVE FOR:19998::"Negative"} . Rubella: {Desc; immune/not/unknown:31571::"Immune"} . Varicella status is {Desc; immune/not/unknown:31571::"Immune"}  PMH: Reviewed and as detailed below: . HTN: {yes/no:20286::"No"}  . Type 1 or 2 Diabetes: {yes/no:20286::"No"}  . Depression:  {yes/no:20286::"No"}  . Seizure disorder:  {yes/no:20286::"No"} . VTE: {yes/no:20286::"No"} ,  . History of STI {yes/no:20286::"No"},  . Abnormal Pap smear:  {yes/no:20286::"No"}, . Genital herpes simplex:  {yes/no:20286::"No"}   PSH: . Gynecologic Surgery:  {No/  **:31982:o:"no"} . Surgical history reviewed, notable for: ***  Obstetric History: . Obstetric history tab updated and reviewed.  . Summary of prior pregnancies: *** . Cesarean delivery: {yes/no:20286::"No"}  . Gestational Diabetes:  {yes/no:20286::"No"} . Hypertension in pregnancy: {yes/no:20286::"No"} . History of preterm birth:  {yes/no:20286::"No"} . History of LGA/SGA infant:  {yes/no:20286::"No"} . History of shoulder dystocia: {yes/no:20286::"No"} . Indications for referral were reviewed, and the patient has no obstetric indications for referral to High Risk OB Clinic at this time.   Social History: . Partner's name: ***  . Tobacco use: {yes/no:20286::"No"} . Alcohol use:  {yes/no:20286::"No"} . Other substance use:  {yes/no:20286::"No"}  Current Medications:  . ***  . Reviewed and appropriate in pregnancy.   Genetic and Infection Screen: . Flow Sheet Updated {yes/no:20286::"Yes"}  Prenatal Exam: Gen: Well nourished, well developed.  No distress.  Vitals noted. HEENT: Normocephalic, atraumatic.  Neck supple without cervical lymphadenopathy, thyromegaly or thyroid nodules.  Fair dentition. CV: RRR no murmur, gallops or rubs Lungs: CTA B.  Normal respiratory effort without wheezes or rales. Abd: soft, NTND. +BS.  Uterus not appreciated above pelvis. GU: Normal external female genitalia without lesions.  Nl vaginal, well rugated without lesions. No vaginal discharge.  Bimanual exam: No adnexal mass or TTP. No CMT.  Uterus size *** Ext: No clubbing, cyanosis or edema. Psych: Normal grooming and dress.  Not depressed or anxious appearing.  Normal thought content and process without flight of ideas or looseness of associations  Fetal heart tones: {appropriate:23337::"Appropriate"}  Assessment/Plan:  Paysen Goza is a 30 y.o. G3P2010 at Unknown who presents to initiate prenatal care. She is doing well.  Current pregnancy issues include ***.  1. Routine prenatal care: Marland Kitchen As dating {ACTION; IS/IS NOT:21021397::"is not"} reliable, a dating ultrasound {HAS HAS NOT:18834::"has"} been ordered. Dating tab updated. . Pre-pregnancy weight updated. Expected weight gain this pregnancy is {weight gain pregnancy :23296::"25-35 pounds "} . Prenatal labs reviewed, notable for ***. . Indications for referral to  [facility] were reviewed and the patient {DOES NOT does:27190::"does not"} meet criteria for referral.  . Medication list reviewed and updated.  . Recommended patient see a dentist for regular care.  . Bleeding and pain precautions reviewed. . Importance of prenatal vitamins reviewed.  Marland Kitchen  Genetic screening offered. Patient opted for: {obgeneticscreen:23414}. . The patient {will/will not be:23415} age 46 or over at time of delivery. Referral to genetic counseling {WAS/WAS NOT:[phone]::"was not"} offered today.  . The patient has the following risk factors for preexisting diabetes: {Pre-existing diabetes screening:23343::"Reviewed indications for early 1 hour glucose testing, not indicated "}. An early 1 hour glucose tolerance test {WAS/WAS NOT:[phone]::"was not"} ordered. . Pregnancy Medical Home and PHQ-9 forms completed, problems noted: {yes/no:20286}  2. *** . ***   Follow up 4 weeks for next prenatal visit.

## 2020-08-01 ENCOUNTER — Ambulatory Visit: Payer: Medicaid Other | Admitting: Family Medicine

## 2020-08-03 ENCOUNTER — Encounter: Payer: Medicaid Other | Admitting: Family Medicine

## 2020-08-14 ENCOUNTER — Ambulatory Visit: Payer: Medicaid Other | Admitting: Family Medicine

## 2020-08-14 NOTE — Progress Notes (Signed)
Patient Name: Kara Monroe Date of Birth: 12-16-1990 [facility] Initial Prenatal Visit  Alvis Pulcini is a 30 y.o. year old G3P2010 at Unknown who presents for her initial prenatal visit. She is accompanied by an in person interpreter.  Pregnancy is planned She reports  denies any pain, fatigue, or other symptoms . She is taking a prenatal vitamin.  She denies pelvic pain or vaginal bleeding.   Pregnancy Dating: The patient is dated by LMP LMP: 04/11/20  Period is certain:  Yes.  Periods were regular:  Yes.  LMP was a typical period:  Yes.  Using hormonal contraception in 3 months prior to conception: No  Lab Review: Blood type: O Rh Status: + Antibody screen: Negative HIV: Negative RPR: Negative Hemoglobin electrophoresis reviewed: Yes Results of OB urine culture are: Negative Rubella: Immune Hep C Ab: Negative Varicella status is Unknown  PMH: Reviewed and as detailed below: HTN: No  Gestational Hypertension/preeclampsia: No  Type 1 or 2 Diabetes: No  Depression:  No  Seizure disorder:  No VTE: No ,  History of STI No,  Abnormal Pap smear:  No, Genital herpes simplex:  No   PSH: Gynecologic Surgery:  no Surgical history reviewed, notable for: n/a  Obstetric History: Obstetric history tab updated and reviewed.  Summary of prior pregnancies: 2 previous term deliveries in 2013 and 2014, and an ectopic pregnancy medically treated in 2018 Cesarean delivery: No  Gestational Diabetes:  No Hypertension in pregnancy: No History of preterm birth: No History of LGA/SGA infant:  No History of shoulder dystocia: No Indications for referral were reviewed, and the patient has no obstetric indications for referral to High Risk OB Clinic at this time.   Social History: Partner's name: Donat N.mboneza Tobacco use: No Alcohol use:  No Other substance use:  No  Current Medications:  No medication  Reviewed and appropriate in pregnancy.    Genetic and Infection Screen: Flow Sheet Updated Yes  Prenatal Exam: Gen: Well nourished, well developed.  No distress.  Vitals noted. HEENT: Normocephalic, atraumatic.  Neck supple without cervical lymphadenopathy, thyromegaly or thyroid nodules.  Fair dentition. CV: RRR no murmur, gallops or rubs Lungs: CTA B.  Normal respiratory effort without wheezes or rales. Abd: soft, NTND. +BS.   GU: Normal external female genitalia without lesions.  Nl vaginal, well rugated without lesions. No vaginal discharge.  Bimanual exam: No adnexal mass or TTP. No CMT.   Ext: No clubbing, cyanosis or edema. Psych: Normal grooming and dress.  Not depressed or anxious appearing.  Normal thought content and process without flight of ideas or looseness of associations  Fetal heart tones: Appropriate : 156 bpm   Assessment/Plan:  Steve Gregg is a 30 y.o. G3P2010 at Unknown who presents to initiate prenatal care. She is doing well.  Current pregnancy issues include: none   Routine prenatal care: As dating is reliable, a dating ultrasound has previously been done. Dating tab updated. Pre-pregnancy weight updated. Expected weight gain this pregnancy is 25-35 pounds  Prenatal labs reviewed, wnl  Indications for referral to [facility] were reviewed and the patient does not meet criteria for referral.  Medication list reviewed and updated.  Recommended patient see a dentist for regular care.  Bleeding and pain precautions reviewed. Importance of prenatal vitamins reviewed.  Genetic screening offered. Patient opted for: no screening. The patient does not have an indication for aspirin therapy beginning at 12-16 weeks. Aspirin was not  recommended today.  The patient will not be age  35 or over at time of delivery. Referral to genetic counseling was not offered today.  The patient has the following risk factors for preexisting diabetes: Reviewed indications for early 1 hour glucose testing, not indicated . An  early 1 hour glucose tolerance test was not ordered. Pregnancy Medical Home and PHQ-9 forms completed, problems noted: No  2. Pregnancy issues include the following which were addressed today:  - not currently taking prenatal MVI- discussed importance of starting it   Follow up 4 weeks for next prenatal visit.

## 2020-08-15 ENCOUNTER — Encounter: Payer: Self-pay | Admitting: Family Medicine

## 2020-08-15 ENCOUNTER — Other Ambulatory Visit (HOSPITAL_COMMUNITY)
Admission: RE | Admit: 2020-08-15 | Discharge: 2020-08-15 | Disposition: A | Discharge status: Home / Self Care | Payer: Medicaid Other | Source: Ambulatory Visit | Attending: Family Medicine | Admitting: Family Medicine

## 2020-08-15 ENCOUNTER — Other Ambulatory Visit: Payer: Self-pay

## 2020-08-15 ENCOUNTER — Ambulatory Visit (INDEPENDENT_AMBULATORY_CARE_PROVIDER_SITE_OTHER): Payer: Medicaid Other | Admitting: Family Medicine

## 2020-08-15 VITALS — BP 80/40 | HR 74 | Wt 128.0 lb

## 2020-08-15 DIAGNOSIS — Z3492 Encounter for supervision of normal pregnancy, unspecified, second trimester: Secondary | ICD-10-CM

## 2020-08-15 DIAGNOSIS — Z3490 Encounter for supervision of normal pregnancy, unspecified, unspecified trimester: Secondary | ICD-10-CM | POA: Insufficient documentation

## 2020-08-15 NOTE — Patient Instructions (Addendum)
It was great seeing you today! Today we did your initial OB visit. I am glad you are doing well!    Today we scheduled you for your anatomy ultrasound. Please see paperwork for the date and time  As we discussed today, please start taking your prenatal vitamin. This is important for your baby's health and development  Please check-out at the front desk before leaving the clinic. I'd like to see you back in 4 weeks for your follow up OB visit but if you need to be seen earlier than that for any new issues we're happy to fit you in, just give [place] a call!  Visit Reminders: - Stop by the pharmacy to pick up your prenatal vitamin  - Continue to work on your healthy eating habits and incorporating exercise into your daily life.     If you haven't already, sign up for My Chart to have easy access to your labs results, and communication with your primary care physician.  Feel free to call with any questions or concerns at any time, at [phone].   Take care,  Dr. Cora Collum [facility]    [email or web address]">  Prenatal Care Prenatal care is health care during pregnancy. It helps you and your unborn baby (fetus) stay as healthy as possible. Prenatal care may be provided by a midwife, a family practice doctor, a Dispensing optician (nurse practitioner or physician assistant), or a childbirth and pregnancy doctor (obstetrician). How does this affect me? During pregnancy, you will be closely monitored for any new conditions that might develop. To lower your risk of pregnancy complications, you and yourhealth care provider will talk about any underlying conditions you have. How does this affect my baby? Early and consistent prenatal care increases the chance that your baby will be healthy during pregnancy. Prenatal care lowers the risk that your baby will be: Born early (prematurely). Smaller than expected at  birth (small for gestational age). What can I expect at the first prenatal care visit? Your first prenatal care visit will likely be the longest. You should schedule your first prenatal care visit as soon as you know that you are pregnant. Your first visit is a good time to talk about any questions or concerns you haveabout pregnancy. Medical history At your visit, you and your health care provider will talk about your medical history, including: Any past pregnancies. Your family's medical history. Medical history of the baby's father. Any long-term (chronic) health conditions you have and how you manage them. Any surgeries or procedures you have had. Any current over-the-counter or prescription medicines, herbs, or supplements that you are taking. Other factors that could pose a risk to your baby, including: Exposure to harmful chemicals or radiation at work or at home. Any substance use, including tobacco, alcohol, and drug use. Your home setting and your stress levels, including: Exposure to abuse or violence. Household financial strain. Your daily health habits, including diet and exercise. Tests and screenings Your health care provider will: Measure your weight, height, and blood pressure. Do a physical exam, including a pelvic and breast exam. Perform blood tests and urine tests to check for: Urinary tract infection. Sexually transmitted infections (STIs). Low iron levels in your blood (anemia). Blood type and certain proteins on red blood cells (Rh antibodies). Infections and immunity to viruses, such as hepatitis B and rubella. HIV (human immunodeficiency virus). Discuss your options for genetic screening. Tips about staying healthy Your health care provider will also give you  information about how to keep yourself and your baby healthy, including: Nutrition and taking vitamins. Physical activity. How to manage pregnancy symptoms such as nausea and vomiting (morning  sickness). Infections and substances that may be harmful to your baby and how to avoid them. Food safety. Dental care. Working. Travel. Warning signs to watch for and when to call your health care provider. How often will I have prenatal care visits? After your first prenatal care visit, you will have regular visits throughout your pregnancy. The visit schedule is often as follows: Up to week 28 of pregnancy: once every 4 weeks. 28-36 weeks: once every 2 weeks. After 36 weeks: every week until delivery. Some women may have visits more or less often depending on any underlyinghealth conditions and the health of the baby. Keep all follow-up and prenatal care visits. This is important. What happens during routine prenatal care visits? Your health care provider will: Measure your weight and blood pressure. Check for fetal heart sounds. Measure the height of your uterus in your abdomen (fundal height). This may be measured starting around week 20 of pregnancy. Check the position of your baby inside your uterus. Ask questions about your diet, sleeping patterns, and whether you can feel the baby move. Review warning signs to watch for and signs of labor. Ask about any pregnancy symptoms you are having and how you are dealing with them. Symptoms may include: Headaches. Nausea and vomiting. Vaginal discharge. Swelling. Fatigue. Constipation. Changes in your vision. Feeling persistently sad or anxious. Any discomfort, including back or pelvic pain. Bleeding or spotting. Make a list of questions to ask your health care provider at your routinevisits. What tests might I have during prenatal care visits? You may have blood, urine, and imaging tests throughout your pregnancy, such as: Urine tests to check for glucose, protein, or signs of infection. Glucose tests to check for a form of diabetes that can develop during pregnancy (gestational diabetes mellitus). This is usually done around week  24 of pregnancy. Ultrasounds to check your baby's growth and development, to check for birth defects, and to check your baby's well-being. These can also help to decide when you should deliver your baby. A test to check for group B strep (GBS) infection. This is usually done around week 36 of pregnancy. Genetic testing. This may include blood, fluid, or tissue sampling, or imaging tests, such as an ultrasound. Some genetic tests are done during the first trimester and some are done during the second trimester. What else can I expect during prenatal care visits? Your health care provider may recommend getting certain vaccines during pregnancy. These may include: A yearly flu shot (annual influenza vaccine). This is especially important if you will be pregnant during flu season. Tdap (tetanus, diphtheria, pertussis) vaccine. Getting this vaccine during pregnancy can protect your baby from whooping cough (pertussis) after birth. This vaccine may be recommended between weeks 27 and 36 of pregnancy. A COVID-19 vaccine. Later in your pregnancy, your health care provider may give you information about: Childbirth and breastfeeding classes. Choosing a health care provider for your baby. Umbilical cord banking. Breastfeeding. Birth control after your baby is born. The hospital labor and delivery unit and how to set up a tour. Registering at the hospital before you go into labor. Where to find more information Office on Women's Health: TravelLesson.ca American Pregnancy Association: americanpregnancy.org March of Dimes: marchofdimes.org Summary Prenatal care helps you and your baby stay as healthy as possible during pregnancy. Your first prenatal care  visit will most likely be the longest. You will have visits and tests throughout your pregnancy to monitor your health and your baby's health. Bring a list of questions to your visits to ask your health care provider. Make sure to keep all follow-up  and prenatal care visits. This information is not intended to replace advice given to you by your health care provider. Make sure you discuss any questions you have with your healthcare provider. Document Revised: 11/18/2019 Document Reviewed: 11/18/2019 Elsevier Patient Education  2022 [employer].

## 2020-08-16 DIAGNOSIS — Z3490 Encounter for supervision of normal pregnancy, unspecified, unspecified trimester: Secondary | ICD-10-CM | POA: Insufficient documentation

## 2020-08-16 LAB — CERVICOVAGINAL ANCILLARY ONLY
Chlamydia: NEGATIVE
Comment: NEGATIVE
Comment: NORMAL
Neisseria Gonorrhea: NEGATIVE

## 2020-08-17 ENCOUNTER — Encounter: Payer: Self-pay | Admitting: Family Medicine

## 2020-09-12 ENCOUNTER — Ambulatory Visit: Payer: Medicaid Other

## 2020-09-14 ENCOUNTER — Ambulatory Visit: Payer: Medicaid Other | Attending: Family Medicine

## 2020-09-14 ENCOUNTER — Other Ambulatory Visit: Payer: Self-pay

## 2020-09-14 DIAGNOSIS — Z3490 Encounter for supervision of normal pregnancy, unspecified, unspecified trimester: Secondary | ICD-10-CM | POA: Diagnosis not present

## 2020-09-27 NOTE — Progress Notes (Signed)
  [facility] Prenatal Visit  Kara Monroe is a 30 y.o. [phone] at [redacted]w[redacted]d here for routine follow up. She is dated by LMP.  She reports no complaints.  She reports good fetal movement. No bleeding, loss of fluid, contractions. Denies feeling dizzy or lightheaded. See flow sheet for details. Vitals:   09/28/20 1513  BP: (!) 88/58  Pulse: 85     A/P: Pregnancy at [redacted]w[redacted]d.  Doing well.   Dating reviewed, dating tab is correct Fetal heart tones Appropriate. 144 bpm  Fundal height within expected range.  22cm  Anatomy ultrasound reviewed and normal  Influenza vaccine not administered as not influenza season. Marland Kitchen  COVID vaccination was discussed and patient vaccinated  Indications for screening for preexisting diabetes include: Reviewed indications for early 1 hour glucose testing, not indicated .  Pregnancy education provided on the following topics: fetal growth and movement, ultrasound assessment, and upcoming laboratory assessment.   Scheduled for [facility] at next available date on 9/22 at 11:30a Preterm labor precautions given.   2. Pregnancy issues include the following and were addressed as appropriate today:  - Patient requested to see gyn, referral placed. Scheduled for [facility] as well. Will plan to complete gtt at this visit - Patient was not yet taking folic acid. Sent in Rx and strongly encouraged her to start taking it  - Problem list and pregnancy box updated: Yes  - Per patient's request provided her with a work letter detailing how much weight she can lift and the breaks that she should take.    Follow up 4 weeks.

## 2020-09-28 ENCOUNTER — Ambulatory Visit (INDEPENDENT_AMBULATORY_CARE_PROVIDER_SITE_OTHER): Payer: Medicaid Other | Admitting: Family Medicine

## 2020-09-28 ENCOUNTER — Other Ambulatory Visit: Payer: Self-pay

## 2020-09-28 VITALS — BP 88/58 | HR 85 | Wt 130.2 lb

## 2020-09-28 DIAGNOSIS — Z3492 Encounter for supervision of normal pregnancy, unspecified, second trimester: Secondary | ICD-10-CM

## 2020-09-28 DIAGNOSIS — Z3A24 24 weeks gestation of pregnancy: Secondary | ICD-10-CM

## 2020-09-28 MED ORDER — FOLIC ACID 400 MCG PO TABS: 400.0000 ug | ORAL_TABLET | Freq: Every day | ORAL | 0 refills | Status: DC

## 2020-09-28 NOTE — Patient Instructions (Addendum)
It was great seeing you today!  Today you had your 4 week OB follow up appointment and I am glad you and baby are doing well! Please start taking Folic acid daily. I have sent in a prescription to your pharmacy   I have submitted a referral to gynecology per your request.   Go to the [facility] at [facility] if: You have cramping/contractions that do not go away with drinking water Your water breaks.  Sometimes it is a big gush of fluid, sometimes it is just a trickle that keeps getting your underwear wet or running down your legs You have vaginal bleeding.    You do not feel your baby moving like normal.  If you do not, get something to eat and drink and lay down and focus on feeling your baby move. If your baby is still not moving like normal, you should go to MAU.   Please check-out at the front desk before leaving the clinic. I'd like to see you back in 4 weeks but if you need to be seen earlier than that for any new issues we're happy to fit you in, just give [place] a call!  Feel free to call with any questions or concerns at any time, at [phone].   Take care,  Dr. Cora Collum [facility]

## 2020-10-02 ENCOUNTER — Other Ambulatory Visit: Payer: Self-pay | Admitting: Family Medicine

## 2020-10-27 ENCOUNTER — Encounter: Payer: Medicaid Other | Admitting: Family Medicine

## 2020-10-27 NOTE — Progress Notes (Deleted)
  [facility] Prenatal Visit  Kara Monroe is a 30 y.o. [phone] at [redacted]w[redacted]d here for routine follow up. She is dated by {Ob dating:14516}.  She reports {symptoms; pregnancy related:14538}. She reports fetal movement. She denies vaginal bleeding, contractions, or loss of fluid. See flow sheet for details.  There were no vitals filed for this visit.    A/P: Pregnancy at [redacted]w[redacted]d.  Doing well.   Routine prenatal care:  Dating reviewed, dating tab is {correct:23336::"correct"} Fetal heart tones {appropriate:23337} Fundal height {fundal height:23342::"within expected range. "} Infant feeding choice: {Breasfeeding:23347::"Breastfeeding"} Contraception choice: {postpartum contraception:23348} Infant circumcision desired {Response; yes/no/na:63}  The patient {CWHCS:23409::"does not have a history of Cesarean delivery and no referral to Center for [facility] is indicated"} Influenza vaccine {given:23340}  Tdap {was/was not:19854::"was"} given today. 1 hour glucola, CBC, RPR, and HIV {WERE / WERE NOT:19253::"were"} obtained today.    Rh status was reviewed and patient {does/does not:19886} need Rhogam.  Rhogam {WAS/WAS [phone]::"was not"} given today.  Pregnancy medical home and PHQ-9 forms {WERE / WERE NOT:19253} done today and reviewed.   Childbirth and education classes {WERE / WERE NOI:37048} offered. Pregnancy education regarding benefits of breastfeeding, contraception, fetal growth, expected weight gain, and safe infant sleep were discussed.  Preterm labor and fetal movement precautions reviewed.  2. Pregnancy issues include the following and were addressed as appropriate today:   ***  Problem list and pregnancy box updated: {yes/no:20286::"Yes"}.   Patient scheduled in [facility] during third trimester on ***.  Follow up 2 weeks.

## 2020-11-09 ENCOUNTER — Other Ambulatory Visit: Payer: Self-pay

## 2020-11-09 ENCOUNTER — Ambulatory Visit (INDEPENDENT_AMBULATORY_CARE_PROVIDER_SITE_OTHER): Payer: Medicaid Other | Admitting: Family Medicine

## 2020-11-09 VITALS — BP 92/62 | HR 64 | Wt 128.4 lb

## 2020-11-09 DIAGNOSIS — Z23 Encounter for immunization: Secondary | ICD-10-CM | POA: Diagnosis present

## 2020-11-09 DIAGNOSIS — Z789 Other specified health status: Secondary | ICD-10-CM

## 2020-11-09 DIAGNOSIS — Z3492 Encounter for supervision of normal pregnancy, unspecified, second trimester: Secondary | ICD-10-CM

## 2020-11-09 MED ORDER — PRENATAL VITAMIN 27-0.8 MG PO TABS: 1.0000 | ORAL_TABLET | Freq: Every day | ORAL | 1 refills | Status: DC

## 2020-11-09 NOTE — Patient Instructions (Signed)
Will ask social worker to reach out to you to help you establish with WIC.  Will see about getting you an appointment at the [facility] office on [address].  Come back Monday for labs at 3pm.  Tdap and flu shot today  Sent prescription for prenatal vitamin Encourage you to get new COVID booster  New due date is 01/15/21.  Go to the MAU at [facility] if: You have cramping/contractions that do not go away with drinking water Your water breaks.  Sometimes it is a big gush of fluid, sometimes it is just a trickle that keeps getting your underwear wet or running down your legs You have vaginal bleeding.    You do not feel your baby moving like normal.  If you do not, get something to eat and drink and lay down and focus on feeling your baby move. If your baby is still not moving like normal, you should go to MAU.   Next visit in 2 weeks  Be well, Dr. Pollie Meyer

## 2020-11-13 ENCOUNTER — Other Ambulatory Visit (INDEPENDENT_AMBULATORY_CARE_PROVIDER_SITE_OTHER): Payer: Medicaid Other

## 2020-11-13 ENCOUNTER — Other Ambulatory Visit: Payer: Self-pay

## 2020-11-13 DIAGNOSIS — Z3483 Encounter for supervision of other normal pregnancy, third trimester: Secondary | ICD-10-CM | POA: Diagnosis present

## 2020-11-13 LAB — POCT 1 HR PRENATAL GLUCOSE: Glucose 1 Hr Prenatal, POC: 104 mg/dL

## 2020-11-14 LAB — CBC
Hematocrit: 29.8 % — ABNORMAL LOW (ref 34.0–46.6)
Hemoglobin: 10.1 g/dL — ABNORMAL LOW (ref 11.1–15.9)
MCH: 30.1 pg (ref 26.6–33.0)
MCHC: 33.9 g/dL (ref 31.5–35.7)
MCV: 89 fL (ref 79–97)
Platelets: 181 10*3/uL (ref 150–450)
RBC: 3.35 x10E6/uL — ABNORMAL LOW (ref 3.77–5.28)
RDW: 12.2 % (ref 11.7–15.4)
WBC: 4.9 10*3/uL (ref 3.4–10.8)

## 2020-11-14 LAB — RPR: RPR Ser Ql: NONREACTIVE

## 2020-11-14 LAB — HIV ANTIBODY (ROUTINE TESTING W REFLEX): HIV Screen 4th Generation wRfx: NONREACTIVE

## 2020-11-17 NOTE — Progress Notes (Signed)
[facility] Visit  Kara Monroe is a 30 y.o. [phone] at [redacted]w[redacted]d (via LMP = 12w u/s) who presents to [facility] for routine follow up. Prenatal course, history, notes, ultrasounds, and laboratory results reviewed. Kinyarwanda interpreter utilized today.  Denies cramping/ctx, fluid leaking, vaginal bleeding, or decreased fetal movement. Taking PNV.    Primary Prenatal Care Provider: Dr. Idalia Needle  FHR: 145 Uterine size: 28  Assessment & Plan  1. Routine prenatal care: - Tdap and flu vaccines given today - unable to do 1 hour GTT due to lab closing, scheduled for 1hr on 11/13/20 along with 28 week labs (CBC, HIV, RPR) - recommended bivalent COVID booster, patient will consider - rx sent for prenatal vitamins - has questions about establishing with WIC, will involve social work for guidance, patient agreeable to their involvement  2. Incorrect dating: - patient reports LMP of 04/10/20 and states she is sure of this. Was having regular periods, not on any recent contraception (last depo shot was May 2021), and this was a typical period for her - ultrasound on 5/19 dated her at [redacted]w[redacted]d, which is off from sure LMP by 1 week.  - recommend dating based on LMP rather than u/s, EDC now 01/15/21.  Patient requests transfer of care to [facility], because she is moving and this will be much closer to her new home. Advised I will send a message to [facility] to see if her care can be transferred to [facility] clinic there.  Next prenatal visit in 2 weeks. Labor & fetal movement precautions discussed.  Levert Feinstein, MD [facility]

## 2020-11-18 ENCOUNTER — Telehealth: Payer: Self-pay | Admitting: Family Medicine

## 2020-11-18 ENCOUNTER — Encounter: Payer: Self-pay | Admitting: Family Medicine

## 2020-11-18 DIAGNOSIS — O99019 Anemia complicating pregnancy, unspecified trimester: Secondary | ICD-10-CM | POA: Insufficient documentation

## 2020-11-18 DIAGNOSIS — D62 Acute posthemorrhagic anemia: Secondary | ICD-10-CM | POA: Insufficient documentation

## 2020-11-18 HISTORY — DX: Acute posthemorrhagic anemia: D62

## 2020-11-18 MED ORDER — FERROUS SULFATE 324 (65 FE) MG PO TBEC: 1.0000 | DELAYED_RELEASE_TABLET | ORAL | 2 refills | Status: DC

## 2020-11-18 NOTE — Telephone Encounter (Signed)
Called patient to discuss lab results from earlier this week. Used phone interpreter. Normal 1hr GTT, HIV, and RPR CBC demonstrates anemia, most certainly iron deficiency in light of normal Hgb electrophoresis earlier in pregnancy. Advised iron every other day - sent in supplement.  Also informed patient that [facility] has accepted her as a transfer of care patient, and she should hear about an appointment either from them or from [place].  Patient appreciative.  Latrelle Dodrill, MD

## 2020-11-27 ENCOUNTER — Encounter: Payer: Self-pay | Admitting: Family Medicine

## 2020-11-27 ENCOUNTER — Other Ambulatory Visit: Payer: Self-pay

## 2020-11-27 ENCOUNTER — Ambulatory Visit (INDEPENDENT_AMBULATORY_CARE_PROVIDER_SITE_OTHER): Payer: Medicaid Other | Admitting: Family Medicine

## 2020-11-27 VITALS — BP 84/65 | HR 72 | Wt 133.8 lb

## 2020-11-27 DIAGNOSIS — Z3493 Encounter for supervision of normal pregnancy, unspecified, third trimester: Secondary | ICD-10-CM

## 2020-11-27 DIAGNOSIS — O99013 Anemia complicating pregnancy, third trimester: Secondary | ICD-10-CM | POA: Diagnosis not present

## 2020-11-27 NOTE — Patient Instructions (Addendum)
It was wonderful to see you today.  Please bring ALL of your medications with you to every visit.   Today we talked about:  ---Increasing water    To apply for [facility], you may visit or call the [facility] office. The [facility] Program has a site located in both the [facility] and [employer]. Both sites are open Monday through Friday from 8:00 a.m. to 5:00 p.m. The [facility] site is open until 7:00 p.m. on the first and third Tuesday of every month. The [place] site is open until 7:00 p.m. on the second Tuesday of every month. Be sure to let staff know if you need an evening appointment. You will be given an appointment to begin the process of receiving services. You can also Request an Appointment online here. The addresses for the [facility] and [facility] Departments are listed below:  [place] [address] [place], [place] [address] [phone] Fax   [place] [address] [place], [place] [address] [phone] Fax    Please review our What to Bring to Your Appointment handout before coming in. This will help [place] make the most of your visit.   Thank you for choosing [facility].   Please call [phone] with any questions about today's appointment.  Please be sure to schedule follow up at the front  desk before you leave today.   Terisa Starr, MD  [facility]   [facility]  [address] [place], [place], [place] [address]

## 2020-11-27 NOTE — Patient Outreach (Signed)
  Medicaid Managed Care Social Work Note  11/27/2020 Name:  Kara Monroe MRN:  [id number] DOB:  [birth year]  Kara Monroe is an 30 y.o. year old female who is a primary patient of Westley Chandler, MD.  The [facility] Managed Care Coordination team was consulted for assistance with:   applying for [facility]  [name] was given information about Medicaid Managed Care Coordination team services today. Kara Monroe Patient agreed to services and verbal consent obtained.  Engaged with patient  for by telephone forinitial visit in response to referral for case management and/or care coordination services.   Assessments/Interventions:  Review of past medical history, allergies, medications, health status, including review of consultants reports, laboratory and other test data, was performed as part of comprehensive evaluation and provision of chronic care management services.  SDOH: (Social Determinant of Health) assessments and interventions performed: BSW received a referral to assist patient with applying for [facility]. BSW provided patient with the telephone number and address for [facility]. Once patient goes to her appointment they may be able to provide an interpreter for her or she can bring someone with her to interpret. No other resources are needed at this time.  Advanced Directives Status:  Not addressed in this encounter.  Care Plan                 No Known Allergies  Medications Reviewed Today     Reviewed by Westley Chandler, MD (Physician) on 11/27/20 at 1449  Med List Status: <None>   Medication Order Taking? Sig Documenting Provider Last Dose Status Informant  ferrous sulfate 324 (65 Fe) MG TBEC [id number] No Take 1 tablet (325 mg total) by mouth every other day.  Patient not taking: Reported on 11/27/2020   Latrelle Dodrill, MD Not Taking Active   folic acid (FOLVITE) 400 MCG tablet [id number] Yes Take 1 tablet (400 mcg total) by mouth daily. Cora Collum, DO Taking  Active   polyethylene glycol (MIRALAX) 17 g packet [id number] Yes Take 17 g by mouth daily. Westley Chandler, MD Taking Active   Prenatal Vit-Fe Fumarate-FA (PRENATAL VITAMIN) 27-0.8 MG TABS [id number] Yes Take 1 tablet by mouth daily. Latrelle Dodrill, MD Taking Active             Patient Active Problem List   Diagnosis Date Noted   Anemia affecting pregnancy 11/18/2020   Intrauterine normal pregnancy 08/16/2020   Adnexal mass 07/09/2020   Refugee health examination 08/31/2019    Conditions to be addressed/monitored per PCP order:   [facility] application  There are no care plans that you recently modified to display for this patient.   Follow up:  Patient agrees to Care Plan and Follow-up.  Plan: The  Patient has been provided with contact information for the Managed Medicaid care management team and has been advised to call with any health related questions or concerns.    Gus Puma, BSW, [facility]  [employer] Risk Managed Medicaid Team  [phone]

## 2020-11-27 NOTE — Patient Instructions (Signed)
Visit Information  Kara Monroe was given information about Medicaid Managed Care team care coordination services as a part of their Healthy [facility] Medicaid benefit. Kara Monroe verbally consented to engagement with the [facility] Managed Care team.   If you are experiencing a medical emergency, please call 911 or report to your local emergency department or urgent care.   If you have a non-emergency medical problem during routine business hours, please contact your provider's office and ask to speak with a nurse.   For questions related to your Healthy [facility] health plan, please call: [phone] or visit the homepage here: [email or web address]  If you would like to schedule transportation through your Healthy [facility] plan, please call the following number at least 2 days in advance of your appointment: [phone]  Call the [facility] Crisis Line at [phone], at any time, 24 hours a day, 7 days a week. If you are in danger or need immediate medical attention call 911.  If you would like help to quit smoking, call 1-800-QUIT-NOW (782 847 3178) OR Espaol: 1-855-Djelo-Ya (9-892-119-4174) o para ms informacin haga clic aqu or Text READY to 081-448 to register via text  Ms. Hijazi - following are the goals we discussed in your visit today:   Goals Addressed   None       Social Worker will follow up with patient in 30 days.   Gus Puma, BSW, [facility]  [place]  High Risk Managed Medicaid Team  [phone]   Following is a copy of your plan of care:  There are no care plans that you recently modified to display for this patient.

## 2020-11-27 NOTE — Progress Notes (Signed)
  [facility] Prenatal Visit  Marti Mclane is a 30 y.o. [id number] at [redacted]w[redacted]d here for routine follow up. She is dated by LMP.  She reports intermittent dizziness when she first stands up.  This is actually improving.  She works at [employer].  She drinks about 3 cups of water a day.  She reports she has been trying to eat more.  She reports fetal movement. She denies vaginal bleeding, contractions, or loss of fluid.  See flow sheet for details.  Vitals:   11/27/20 1124  BP: (!) 84/65  Pulse: 72     A/P: Pregnancy at [redacted]w[redacted]d.  She is intermittently dizzy which is actually improving.  We discussed increasing frequency of eating as well as an proving intake of fluids. Routine prenatal care:  Dating reviewed, dating tab is correct (per Ob Faculty note--she is sure of LMP)  Fetal heart tones: Appropriate Fundal height: within expected range.  The patient is transferring her care to [facility] due to proximity to her house.  We reviewed that time date and address of this appointment. Infant feeding choice: Breastfeeding Contraception choice: Undecided  Infant circumcision desired not applicable Influenza vaccine previously administered.   COVID vaccination was discussed and declined.  Childbirth and education classes were offered. Pregnancy education regarding benefits of breastfeeding, contraception, fetal growth, expected weight gain, and safe infant sleep were discussed.  Preterm labor and fetal movement precautions reviewed. Discussed increasing fluids, increasing snacks, location of 'MAU', preterm labor  precautsion    2. Pregnancy issues include the following and were addressed as appropriate today:  Anemia affecting pregnancy, suspect iron deficiency.  Discussed with her how to obtain ferrous sulfate.  She is aware of over-the-counter medications and teach back method was used to ensure understanding.  She will pick up today.  Problem list and pregnancy box updated:  Yes.   Patient is transferring care to obstetrics care [address].  Follow-up reminder to self to make sure she attends the appointment.   Terisa Starr, MD  Family Medicine Teaching Service

## 2020-11-27 NOTE — Assessment & Plan Note (Signed)
Not yet taking iron pills.  Discussed how to obtain these over-the-counter.  Patient took a picture with her phone and will pick up today.  Recommend ferritin and CBC at follow-up.

## 2020-12-13 ENCOUNTER — Encounter: Payer: Self-pay | Admitting: Obstetrics & Gynecology

## 2020-12-13 ENCOUNTER — Ambulatory Visit (INDEPENDENT_AMBULATORY_CARE_PROVIDER_SITE_OTHER): Payer: Medicaid Other | Admitting: Obstetrics & Gynecology

## 2020-12-13 ENCOUNTER — Other Ambulatory Visit: Payer: Self-pay

## 2020-12-13 VITALS — BP 90/65 | HR 75 | Wt 133.9 lb

## 2020-12-13 DIAGNOSIS — D649 Anemia, unspecified: Secondary | ICD-10-CM

## 2020-12-13 DIAGNOSIS — O99013 Anemia complicating pregnancy, third trimester: Secondary | ICD-10-CM

## 2020-12-13 DIAGNOSIS — O0990 Supervision of high risk pregnancy, unspecified, unspecified trimester: Secondary | ICD-10-CM | POA: Insufficient documentation

## 2020-12-13 DIAGNOSIS — O0993 Supervision of high risk pregnancy, unspecified, third trimester: Secondary | ICD-10-CM

## 2020-12-13 DIAGNOSIS — Z3A35 35 weeks gestation of pregnancy: Secondary | ICD-10-CM

## 2020-12-13 NOTE — Progress Notes (Signed)
  Subjective:    Kara Monroe is a [id number] [redacted]w[redacted]d being seen today for her first obstetrical visit.  Her obstetrical history is significant for  transfer from [facility] . Patient does intend to breast feed. Pregnancy history fully reviewed.  Patient reports no complaints.  Vitals:   12/13/20 0859  BP: 90/65  Pulse: 75  Weight: 133 lb 14.4 oz (60.7 kg)    HISTORY: OB History  Gravida Para Term Preterm AB Living  4 2 2  0 1 2  SAB IAB Ectopic Multiple Live Births  0 0 0   2    # Outcome Date GA Lbr Len/2nd Weight Sex Delivery Anes PTL Lv  4 Current           3 Term 11/14/12 [redacted]w[redacted]d  8 lb 13.1 oz (4 kg) M Vag-Spont  N LIV  2 Term 07/05/11 [redacted]w[redacted]d  7 lb 11.5 oz (3.5 kg) F    LIV  1 AB             Obstetric Comments  Two prior vaginal deliveries   Had one early miscarriage    No past medical history on file. No past surgical history on file. Family History  Problem Relation Age of Onset   Healthy Mother    Healthy Father      Exam    Uterus:   34 cm FH  Pelvic Exam:    Perineum:    Vulva:    Vagina:     pH:    Cervix:    Adnexa:    Bony Pelvis:   System: Breast:     Skin: normal coloration and turgor, no rashes    Neurologic: normal   Extremities: normal strength, tone, and muscle mass   HEENT PERRLA   Mouth/Teeth    Neck supple   Cardiovascular: regular rate and rhythm   Respiratory:  appears well, vitals normal, no respiratory distress, acyanotic, normal RR, neck free of mass or lymphadenopathy   Abdomen: gravid   Urinary:       Assessment:    Pregnancy: [redacted]w[redacted]d Patient Active Problem List   Diagnosis Date Noted   Supervision of high risk pregnancy, antepartum 12/13/2020   Anemia affecting pregnancy 11/18/2020   Intrauterine normal pregnancy 08/16/2020   Adnexal mass 07/09/2020   Refugee health examination 08/31/2019        Plan:     Initial labs drawn. Prenatal vitamins. Problem list reviewed and updated. Genetic Screening  discussed : ordered.  Ultrasound discussed; fetal survey: results reviewed.  Follow up in 1 weeks. 50% of 30 min visit spent on counseling and coordination of care.  F/u 09/02/2019   [place] 12/13/2020

## 2020-12-13 NOTE — Assessment & Plan Note (Signed)
  Nursing Staff Provider  Office Location  [facility] [place] Dating    Language  Kinyarwanda; [place] Anatomy US    Flu Vaccine   Genetic/Carrier Screen  NIPS:    AFP:    Horizon:  TDaP Vaccine    Hgb A1C or  GTT Early  Third trimester   COVID Vaccine    LAB RESULTS   Rhogam   Blood Type O/Positive/-- (04/27 1608)   Baby Feeding Plan Both Antibody Negative (04/27 1608)  Contraception Unsure Rubella 15.10 (04/27 1608)  Circumcision NA RPR Non Reactive (09/26 1619)   Pediatrician  Unsure HBsAg Negative (04/27 1608)   Support Person FOB HCVAb   Prenatal Classes  HIV Non Reactive (09/26 1619)     BTL Consent  GBS   (For PCN allergy, check sensitivities)   VBAC Consent  Pap        DME Rx Arly.Keller ] BP cuff [ ]  Weight Scale Waterbirth  [ ]  Class [ ]  Consent [ ]  CNM visit  PHQ9 & GAD7 [ X ] new OB [  ] 28 weeks  [  ] 36 weeks Induction  [ ]  Orders Entered [ ] Foley Y/N

## 2020-12-14 LAB — HEMOGLOBIN A1C
Est. average glucose Bld gHb Est-mCnc: 100 mg/dL
Hgb A1c MFr Bld: 5.1 % (ref 4.8–5.6)

## 2020-12-14 LAB — CBC/D/PLT+RPR+RH+ABO+RUBIGG...
Antibody Screen: NEGATIVE
Basophils Absolute: 0 10*3/uL (ref 0.0–0.2)
Basos: 0 %
EOS (ABSOLUTE): 0.1 10*3/uL (ref 0.0–0.4)
Eos: 2 %
HCV Ab: <0.1 s/co ratio (ref 0.0–0.9)
HIV Screen 4th Generation wRfx: NONREACTIVE
Hematocrit: 32.1 % — ABNORMAL LOW (ref 34.0–46.6)
Hemoglobin: 10.8 g/dL — ABNORMAL LOW (ref 11.1–15.9)
Hepatitis B Surface Ag: NEGATIVE
Immature Grans (Abs): 0 10*3/uL (ref 0.0–0.1)
Immature Granulocytes: 1 %
Lymphocytes Absolute: 1.6 10*3/uL (ref 0.7–3.1)
Lymphs: 34 %
MCH: 28.2 pg (ref 26.6–33.0)
MCHC: 33.6 g/dL (ref 31.5–35.7)
MCV: 84 fL (ref 79–97)
Monocytes Absolute: 0.5 10*3/uL (ref 0.1–0.9)
Monocytes: 9 %
Neutrophils Absolute: 2.6 10*3/uL (ref 1.4–7.0)
Neutrophils: 54 %
Platelets: 167 10*3/uL (ref 150–450)
RBC: 3.83 x10E6/uL (ref 3.77–5.28)
RDW: 12.8 % (ref 11.7–15.4)
RPR Ser Ql: NONREACTIVE
Rh Factor: POSITIVE
Rubella Antibodies, IGG: 14.4 index (ref >0.99)
WBC: 4.8 10*3/uL (ref 3.4–10.8)

## 2020-12-14 LAB — HCV INTERPRETATION

## 2020-12-15 LAB — URINE CULTURE, OB REFLEX

## 2020-12-15 LAB — CULTURE, OB URINE

## 2020-12-21 ENCOUNTER — Other Ambulatory Visit (HOSPITAL_COMMUNITY)
Admission: RE | Admit: 2020-12-21 | Discharge: 2020-12-21 | Disposition: A | Discharge status: Home / Self Care | Payer: Medicaid Other | Source: Ambulatory Visit | Attending: Obstetrics & Gynecology | Admitting: Obstetrics & Gynecology

## 2020-12-21 ENCOUNTER — Other Ambulatory Visit: Payer: Self-pay

## 2020-12-21 ENCOUNTER — Encounter: Payer: Medicaid Other | Admitting: Obstetrics & Gynecology

## 2020-12-21 ENCOUNTER — Ambulatory Visit (INDEPENDENT_AMBULATORY_CARE_PROVIDER_SITE_OTHER): Payer: Medicaid Other | Admitting: Medical

## 2020-12-21 VITALS — BP 93/61 | HR 85 | Wt 134.8 lb

## 2020-12-21 DIAGNOSIS — O0990 Supervision of high risk pregnancy, unspecified, unspecified trimester: Secondary | ICD-10-CM | POA: Diagnosis not present

## 2020-12-21 DIAGNOSIS — O99013 Anemia complicating pregnancy, third trimester: Secondary | ICD-10-CM | POA: Diagnosis not present

## 2020-12-21 DIAGNOSIS — Z3493 Encounter for supervision of normal pregnancy, unspecified, third trimester: Secondary | ICD-10-CM

## 2020-12-21 DIAGNOSIS — Z3A36 36 weeks gestation of pregnancy: Secondary | ICD-10-CM | POA: Diagnosis not present

## 2020-12-21 NOTE — Progress Notes (Signed)
   PRENATAL VISIT NOTE  Subjective:  Kara Monroe is a 30 y.o. [id number] at [redacted]w[redacted]d being seen today for ongoing prenatal care.  She is currently monitored for the following issues for this high-risk pregnancy and has Refugee health examination; Adnexal mass; Intrauterine normal pregnancy; Anemia affecting pregnancy; and Supervision of high risk pregnancy, antepartum on their problem list.  Patient reports no complaints.  Contractions: Not present. Vag. Bleeding: None.  Movement: Present. Denies leaking of fluid.   The following portions of the patient's history were reviewed and updated as appropriate: allergies, current medications, past family history, past medical history, past social history, past surgical history and problem list.   Objective:   Vitals:   12/21/20 1014  BP: 93/61  Pulse: 85  Weight: 134 lb 12.8 oz (61.1 kg)    Fetal Status: Fetal Heart Rate (bpm): 136 Fundal Height: 34 cm Movement: Present  Presentation: Vertex  General:  Alert, oriented and cooperative. Patient is in no acute distress.  Skin: Skin is warm and dry. No rash noted.   Cardiovascular: Normal heart rate noted  Respiratory: Normal respiratory effort, no problems with respiration noted  Abdomen: Soft, gravid, appropriate for gestational age.  Pain/Pressure: Absent     Pelvic: Cervical exam performed in the presence of a chaperone Dilation: 2 Effacement (%): 50 Station: -3  Extremities: Normal range of motion.  Edema: None  Mental Status: Normal mood and affect. Normal behavior. Normal judgment and thought content.   Assessment and Plan:  Pregnancy: N4O2703 at [redacted]w[redacted]d 1. Supervision of high risk pregnancy, antepartum - Culture, beta strep (group b only) - GC/Chlamydia probe amp ()not at [facility]  2. Anemia in pregnancy, third trimester - Taking Iron    3. [redacted] weeks gestation   Preterm labor symptoms and general obstetric precautions including but not limited to vaginal bleeding,  contractions, leaking of fluid and fetal movement were reviewed in detail with the patient. Please refer to After Visit Summary for other counseling recommendations.   Return in about 1 week (around 12/28/2020) for LOB, any provider, In-Person.  Future Appointments  Date Time Provider Department Center  12/25/2020  8:30 AM [facility] NURSE [facility]  12/25/2020  8:45 AM [facility] US6 [facility]  01/01/2021  1:15 PM Hermina Staggers, MD [facility]  01/01/2021  3:15 PM [facility]WOCA NST [facility]  01/08/2021  2:15 PM Brand Males, CNM [facility]  01/08/2021  3:15 PM [facility] NST [facility]    Vonzella Nipple, PA-C

## 2020-12-21 NOTE — Patient Instructions (Addendum)
AREA PEDIATRIC/FAMILY PRACTICE PHYSICIANS  [place]outheast [place] ([address]) [facility] Chambliss, MD; Eniola, MD; Hale, MD; Hensel, MD; McDiarmid, MD; McIntyer, MD; Neal, MD; Walden, MD [address] [place], [place] [address] [phone] Mon-Fri 8:30-12:30, 1:30-5:00 Providers come to see babies at [facility] Accepting Medicaid [facility] providers who accept newborns: Koirala, MD; Morrow, MD; Wolters, MD [address], [place], [place] [address] [phone] Mon-Fri 8:00-5:30 Babies seen by providers at [facility] Does NOT accept Medicaid Please call early in hospitalization for appointment (limited availability)  [facility] Mulberry, MD [address] [place], [place] [address] [phone] Mon, Tue, Thur, Fri 8:30-5:00, Wed 10:00-7:00 (closed 1-2pm) Babies seen by [facility] providers Accepting Medicaid Rubin - Pediatrician Rubin, MD [address], [place], [place] [address] [phone] Mon-Fri 8:30-5:00, Sat 8:30-12:00 Provider comes to see babies at [facility] Accepting Medicaid Must have been referred from current patients or contacted office prior to delivery [facility] ([facility]) Brown, MD; [place], MD; Ettefagh, MD; Grant, MD; Lester, MD; McCormick, MD; McQueen, MD; Prose, MD; Simha, MD; Stanley, MD; Stryffeler, NP; Tebben, NP [address], [place], [place] [address] [phone] Mon, Tue, Thur, Fri 8:30-5:30, Wed 9:30-5:30, Sat 8:30-12:30 Babies seen by [facility] providers Accepting Medicaid Only accepting infants of first-time parents or siblings of current patients Hospital discharge coordinator will make follow-up appointment Jack Amos [address], [place], [place]  [address] [phone]   Fax - [phone] [facility] [address], [place], [place]  [address] Phone - [phone]   Fax - [phone] Shilpa Gosrani [address], [place], [place]  [address] [phone]  [place]heast [place] ([address]) [facility] Bates, MD; Brassfield, MD; Cooper, Cox, MD; MD; Davis, MD; Dovico, MD; Ettefaugh, MD; Little, MD; Lowe, MD; Keiffer, MD; Melvin, MD; Sumner, MD; Williams, MD [address], [place], [place] [address] [phone] Mon-Fri 8:30-5:00 (extended evenings Mon-Thur as needed), Sat-Sun 10:00-1:00 Providers come to see babies at [facility] Accepting Medicaid for families of first-time babies and families with all children in the household age 3 and under. Must register with office prior to making appointment (M-F only). [facility] Henson, NP; Knapp, MD; Lalonde, MD; Tysinger, PA [address] [place], [place] [address] [phone] Mon-Fri 8:00-5:00 Babies seen by providers at [facility] Does NOT accept Medicaid/Commercial Insurance Only [facility] ([facility])  Artis, MD; Barnes, MD; Bratton, MD; Coccaro, MD; [place] Gardner, MD; Kramer, MD; Marshall, MD; Netherton, MD; Poleto, MD; Skinner, MD [address] [place], [place] [address] [phone] Mon-Fri 8:30-5:30, Sat (Oct.-Mar.) 9:00-1:00 Babies seen by providers at [facility] Accepting Medicaid  [place] ([address]) [facility] Reid, MD; Warner, MD [address], [place], [place] [address] [phone] Mon-Fri 8:30-5:00, Sat 8:30-12:00 Providers come to see babies at [facility] Does NOT accept Medicaid [facility] Becker, PA; Hagler, MD; Scifres, PA; Sun, MD; Swayne, MD [address], [place], [place] [address] [phone] Mon-Fri 8:00-5:00 Babies seen by providers at [facility] Does NOT accept Medicaid Only accepting babies of parents who  are patients Please call early in hospitalization for appointment (limited availability) [facility] Clark, MD; Frye, MD; Kelleher, MD; Mack, NP; Miller, MD; O'Keller, MD; Patterson, NP; Pudlo, MD; Puzio, MD; Thomas, MD; Tucker, MD; Twiselton, MD [address], [place], [place] [address] [phone] Mon-Fri 8:00-5:00, Sat 9:00-12:00 Providers come to see babies at [facility] Does NOT accept Medicaid  [place] ([address]) [facility] providers accepting new patients: Brake, NP; Wharton, PA [address], [place], [place] [address] [phone] Mon-Fri 8:00-5:00 Babies seen by providers at [facility] Does NOT accept Medicaid Only accepting babies of parents who are patients Please call early in hospitalization for appointment (limited availability) [facility] Gay, MD; Quinlan, MD [address] [place], [place] [address] [phone] (press [phone] to schedule appointment) Mon-Fri 8:00-5:00 Providers come to see babies at [facility] Does NOT accept Medicaid [facility] Mazer, MD [address] [place], [place] [address] [phone] Mon-Fri 8:30-5:00 (lunch 12:30-1:00), extended hours by appointment only Wed 5:00-6:30 Babies seen by [facility] providers Accepting Medicaid [facility] Banks, MD; Jordan, MD; Koberlein, MD [address], [place], [place] [address] [phone] Mon-Fri 8:00-5:00 Babies seen by [facility] providers Does NOT accept Medicaid [facility] Parker, MD; [place], MD; Wallace, DO [address] [place], [place] [address] [phone] Mon-Fri 8:00-5:00 Babies seen by [facility] providers Does NOT accept Medicaid [facility] Brandon, PA; Brecken, PA; Christy, NP; Dees, MD; DeClaire, MD; DeWeese, MD; Hansen, NP; Mills, NP; Parrish, NP; Smoot, NP; Summer, MD; Vapne,  MD [address] [place], [place] [address] [phone] Mon-Fri 8:30-5:00, Sat 10:00-1:00 Providers come to see babies at [facility] Does NOT accept Medicaid Free prenatal information session Tuesdays at 4:45pm [facility] Bouska, MD; Gordon, PA; Jeffery, PA; Weber, PA [address] [place] [address] [phone] Mon-Fri 7:30-5:30 Babies seen by [facility] providers [facility] [address], [place], [place]  [address] [phone]   Fax - [phone]  [place] ([address] & [address]) [facility] [place] [address] [place], [place] [address] [phone] Mon-Thur 8:00-6:00 Providers come to see babies at [facility] Accepting Medicaid [facility] Anderson, NP; Badger, MD; Beal, PA; Spencer, PA [address] [place], [place] [address] [phone] Mon-Thur 7:30-7:30, Fri 7:30-4:30 Babies seen by [facility] providers Accepting Medicaid [facility] Agbuya, MD; Klett, NP; Romgoolam, MD [address], [place], [place] [address] [phone] Mon-Fri 8:30-5:00, Sat 8:30-12:00 Providers come to see babies at [facility] Accepting Medicaid Must have "Meet & Greet" appointment at office prior to delivery [facility] ([facility]) McCord, MD; Wallace, MD; Wood, MD [address], [place], [place] [address] [phone] Mon-Wed 8:00-6:00, Thur-Fri 8:00-5:00, Sat 9:00-12:00 Providers come to see babies at [facility] Does NOT accept Medicaid Only accepting siblings of current patients [facility]  [address], [place], [place]  [address] [phone]   Fax - [phone] [facility] [address], [place], [place]  [address] [phone]   Fax -  [phone]  [place] ([address] & [address]) [facility] Cirigliano, DO; [place], DO [address] , [place] [address] [phone] Mon-Fri 7:00-5:00 Babies seen by [facility] providers Does NOT accept Medicaid [facility] Briscoe, MD; Howley, PA; Moreira, PA [address], [place], [place] [address] [phone] Mon-Fri 8:00-5:00 Babies seen by [facility] providers Accepting Medicaid [facility] Boyd, MD; Church, PA; Jones, NP; Osborn, PA [address], , [place] [address] [phone] Mon-Fri 8:00-5:00 Babies seen by providers at [facility] Accepting Medicaid  [place] ([address]) [facility] [place] Wendling, DO [address] [place], [place] [address] [phone] Mon-Fri 8:00-5:00 Babies seen by [facility] providers Does NOT accept Medicaid Limited availability, please call early in hospitalization to schedule follow-up [facility] Calderon, PA; Cummings, MD; Dillard, MD; Martin, PA; Olson, MD; VanDeven, PA [address], [place], [place] [address] [phone] Mon-Fri 8:30-5:00, Sat 9:00-12:00 Babies seen by providers at [facility] Accepting Medicaid Please register online then schedule online or call office [email or web address] ([facility] [place], NP; Kumar, MD; Martin Rogers, PA [address], [place], [place] [address] [phone] Mon-Fri 8:00-5:00 Babies seen by providers at [facility] Accepting Medicaid [facility] ([facility]) Belgium, MD; Kristi Fleenor, NP; West, MD [address], [place], [place] [address] [phone] Mon-Fri 8:00-5:30, Sat&Sun by appointment (phones open at  8:30) Babies seen by [facility] providers Accepting Medicaid Must be a first-time baby or sibling of current patient [facility] [place]  [address], [place], [place]  [address] [phone]   Fax - [phone]  [place] ([address]) [facility] Brown, PA; Cowen, PA; Rice, MD; Helton, PA; Spry, MD [address] [place], [place] [address] [phone] Mon-Thur 8:00-7:00, Fri 8:00-5:00, Sat 8:00-12:00, Sun 9:00-12:00 Babies seen by [facility] providers Accepting Medicaid [facility] Coe-Goins, MD; Marshall, MD; Pierre-Louis, MD [address], [place], [place] [address] [phone] Mon-Thur 8:00-5:00 Babies seen by providers at [facility] Accepting Medicaid [facility] Bratton, MD; Coe-Goins, MD; Hayes, MD; Lewis, MD; List, MD; Lott, MD; Marshall, MD; Moran, MD; O'Neal, MD; Pierre-Louis, MD; Pitonzo, MD; Scholer, MD; Spangle, MD [address] [place], [place] [address] [phone] Mon-Fri 8:00-5:30, Sat (Oct.-Mar.) 9:00-1:00 Babies seen by providers at [facility] Accepting Medicaid Must fill out new patient packet, available online at [email or web address] [facility] ([facility]) Friddle, NP; Harris, NP; Kelly, NP; Logan, MD; Melvin, PA; Poth, MD; Ramadoss, MD; Stanton, NP [address], [place], [place] [address] [phone] Mon-Thur 8:00-5:30, Fri 8:00-5:00 Babies seen by providers at [facility] Accepting Medicaid  [place] ([address]) [facility] Dixon, PA; Stedman, MD; Pickard, MD; Tapia, PA [address], [place], [place] [address] [phone] Mon-Fri 8:00-5:00 Babies seen by providers at [facility] Accepting Medicaid   [place] ([address]) [facility] [place] Masneri, DO; Meyers, MD; Nelson, PA [address], [place], [place] [address] [phone] Mon-Fri 8:00-5:00 Babies seen by providers at [facility] Does NOT accept Medicaid Limited appointment availability, please call early in hospitalization  [facility] [place] Kunedd, DO; McGowen, MD [address], [place], [place] [address] [phone] Mon-Fri 8:00-5:00 Babies seen by [facility] providers Does NOT accept Medicaid [facility] [place] Cameron, MD; MacDonald, MD; Michaels, PA; Nayak, MD [address] [place] [address], [place],  [address] [phone] Mon-Fri 8:00-5:00 After hours clinic ([address] [place],  [address]) [phone] Mon-Fri 5:00-8:00, Sat 12:00-6:00, Sun 10:00-4:00 Babies seen by [facility] providers Accepting Medicaid [facility] [place] [address], [place], [place]  [address] [phone]   Fax - [phone]  [place] ([address]) [facility] [place] Andy, MD [address], [place], [place] [address] [phone] Mon-Fri 8:00-5:00 Babies seen by [facility] providers Does NOT accept Medicaid [facility] [place] ([facility] [place]) Eksir, MD [address], [place], [place] [address] [phone] Mon-Thur 8:00-7:00, Fri 8:00-5:00, Sat 8:00-12:00 Babies seen by providers at [facility] Accepting Medicaid - but does not have vaccinations in office (must be received elsewhere) Limited availability, please call early in hospitalization  [place] ([address]) [facility]  Charlene Flemming, MD [address], [place] [address] [phone]  Fax [phone]  [facility]  Kimberly Newton, MD, Annamarie Streilein, PA, Carla Hampton, PA [address] [place] [place], [place]  [address] [phone] [facility]  [address], [place], [place] [address] [phone] [address], [place], [place] [address] [phone] (West Office)  [facility] [address], [place], [place] [address] [phone] [facility] [address] [place], [place], [place] [address] [phone] [facility] [address], [place], [place] [address] [phone] [facility] [address], [place], [place] [address] [phone] [facility] [address], [place], [place] [address] [phone] [facility] [address], [place], [place] [address] [phone] [facility]  [address], [place], [place] [address] [phone] Dr. [facility] [address] [place], [place], [place] [address] [phone] [facility] [address], [place],  [address] [phone] [facility] [address], [place],  [address] [phone]  

## 2020-12-22 LAB — GC/CHLAMYDIA PROBE AMP (~~LOC~~) NOT AT ARMC
Chlamydia: NEGATIVE
Comment: NEGATIVE
Comment: NORMAL
Neisseria Gonorrhea: NEGATIVE

## 2020-12-24 LAB — CULTURE, BETA STREP (GROUP B ONLY): Strep Gp B Culture: NEGATIVE

## 2020-12-25 ENCOUNTER — Ambulatory Visit: Payer: Medicaid Other | Admitting: *Deleted

## 2020-12-25 ENCOUNTER — Encounter: Payer: Self-pay | Admitting: *Deleted

## 2020-12-25 ENCOUNTER — Ambulatory Visit: Payer: Medicaid Other | Attending: Obstetrics & Gynecology

## 2020-12-25 ENCOUNTER — Other Ambulatory Visit: Payer: Self-pay

## 2020-12-25 VITALS — BP 92/57 | HR 63

## 2020-12-25 DIAGNOSIS — O0990 Supervision of high risk pregnancy, unspecified, unspecified trimester: Secondary | ICD-10-CM | POA: Insufficient documentation

## 2021-01-01 ENCOUNTER — Encounter: Payer: Medicaid Other | Admitting: Obstetrics and Gynecology

## 2021-01-01 ENCOUNTER — Other Ambulatory Visit: Payer: Medicaid Other

## 2021-01-04 ENCOUNTER — Other Ambulatory Visit: Payer: Self-pay

## 2021-01-04 ENCOUNTER — Other Ambulatory Visit: Payer: Medicaid Other | Admitting: *Deleted

## 2021-01-08 ENCOUNTER — Other Ambulatory Visit: Payer: Medicaid Other

## 2021-01-08 ENCOUNTER — Ambulatory Visit (INDEPENDENT_AMBULATORY_CARE_PROVIDER_SITE_OTHER): Payer: Medicaid Other

## 2021-01-08 ENCOUNTER — Other Ambulatory Visit: Payer: Self-pay

## 2021-01-08 VITALS — BP 91/55 | HR 109 | Wt 135.0 lb

## 2021-01-08 DIAGNOSIS — O0990 Supervision of high risk pregnancy, unspecified, unspecified trimester: Secondary | ICD-10-CM | POA: Diagnosis not present

## 2021-01-08 DIAGNOSIS — Z3A39 39 weeks gestation of pregnancy: Secondary | ICD-10-CM | POA: Diagnosis not present

## 2021-01-08 DIAGNOSIS — Z789 Other specified health status: Secondary | ICD-10-CM | POA: Diagnosis not present

## 2021-01-08 NOTE — Patient Instructions (Signed)
AREA PEDIATRIC/FAMILY PRACTICE PHYSICIANS  [place] [facility] ([address]) [facility] Chambliss, MD; Eniola, MD; Hale, MD; Hensel, MD; McDiarmid, MD; McIntyer, MD; Mykeal Carrick, MD; Walden, MD [address] [place], [place] [address] [phone] Mon-Fri 8:30-12:30, 1:30-5:00 Providers come to see babies at [facility] Accepting Medicaid [facility] providers who accept newborns: Koirala, MD; Morrow, MD; Wolters, MD [address], [place], [place] [address] [phone] Mon-Fri 8:00-5:30 Babies seen by providers at [facility] Does NOT accept Medicaid Please call early in hospitalization for appointment (limited availability)  [facility] [place], MD [address] [place], [place] [address] [phone] Mon, Tue, Thur, Fri 8:30-5:00, Wed 10:00-7:00 (closed 1-2pm) Babies seen by [facility] providers Accepting Medicaid Rubin - Pediatrician Rubin, MD [address], [place], [place] [address] [phone] Mon-Fri 8:30-5:00, Sat 8:30-12:00 Provider comes to see babies at [facility] Accepting Medicaid Must have been referred from current patients or contacted office prior to delivery [facility] ([facility]) Brown, MD; Chandler, MD; Ettefagh, MD; Grant, MD; Lester, MD; McCormick, MD; McQueen, MD; Prose, MD; Simha, MD; Stanley, MD; Stryffeler, NP; Tebben, NP [address], [place], [place] [address] [phone] Mon, Tue, Thur, Fri 8:30-5:30, Wed 9:30-5:30, Sat 8:30-12:30 Babies seen by [facility] providers Accepting Medicaid Only accepting infants of first-time parents or siblings of current patients Hospital discharge coordinator will make follow-up appointment Jack Amos [address], [place], [place]  [address] [phone]   Fax - [phone] [facility] [address], [place], [place]  [address] Phone - [phone]   Fax - [phone] Shilpa Gosrani [address], [place], [place]  [address] [phone]  [place] ([address]) [facility] Bates, MD; Brassfield, MD; Cooper, Cox, MD; MD; Davis, MD; Dovico, MD; Ettefaugh, MD; Little, MD; Lowe, MD; Keiffer, MD; Melvin, MD; Sumner, MD; Williams, MD [address], [place], [place] [address] [phone] Mon-Fri 8:30-5:00 (extended evenings Mon-Thur as needed), Sat-Sun 10:00-1:00 Providers come to see babies at [facility] Accepting Medicaid for families of first-time babies and families with all children in the household age 3 and under. Must register with office prior to making appointment (M-F only). [facility] Henson, NP; Knapp, MD; Lalonde, MD; Tysinger, PA [address] [place], [place] [address] [phone] Mon-Fri 8:00-5:00 Babies seen by providers at [facility] Does NOT accept Medicaid/Commercial Insurance Only [facility] ([facility])  Artis, MD; Barnes, MD; Bratton, MD; Coccaro, MD; Lockett Gardner, MD; Kramer, MD; Marshall, MD; Netherton, MD; Poleto, MD; Skinner, MD [address] [place], [place] [address] [phone] Mon-Fri 8:30-5:30, Sat (Oct.-Mar.) 9:00-1:00 Babies seen by providers at [facility] Accepting Medicaid  [place] ([address]) [facility] Reid, MD; Warner, MD [address], [place], [place] [address] [phone] Mon-Fri 8:30-5:00, Sat 8:30-12:00 Providers come to see babies at [facility] Does NOT accept Medicaid [facility] Becker, PA; Hagler, MD; Scifres, PA; Sun, MD; Swayne, MD [address], [place], [place] [address] [phone] Mon-Fri 8:00-5:00 Babies seen by providers at [facility] Does NOT accept Medicaid Only accepting babies of parents who  are patients Please call early in hospitalization for appointment (limited availability) [facility] Clark, MD; Frye, MD; Kelleher, MD; Mack, NP; Miller, MD; O'Keller, MD; Patterson, NP; Pudlo, MD; Puzio, MD; Thomas, MD; Tucker, MD; Twiselton, MD [address], [place], [place] [address] [phone] Mon-Fri 8:00-5:00, Sat 9:00-12:00 Providers come to see babies at [facility] Does NOT accept Medicaid  [place] ([address]) [facility] providers accepting new patients: Brake, NP; Wharton, PA [address], [place], [place] [address] [phone] Mon-Fri 8:00-5:00 Babies seen by providers at [facility] Does NOT accept Medicaid Only accepting babies of parents who are patients Please call early in hospitalization for appointment (limited availability) [facility] Gay, MD; Quinlan, MD [address] [place], [place] [address] [phone] (press [phone] to schedule appointment) Mon-Fri 8:00-5:00 Providers come to see babies at [facility] Does NOT accept Medicaid [facility] Mazer, MD [address] [place], [place] [address] [phone] Mon-Fri 8:30-5:00 (lunch 12:30-1:00), extended hours by appointment only Wed 5:00-6:30 Babies seen by [facility] providers Accepting Medicaid [facility] Banks, MD; Jordan, MD; Koberlein, MD [address], [place], [place] [address] [phone] Mon-Fri 8:00-5:00 Babies seen by [facility] providers Does NOT accept Medicaid [facility] Parker, MD; Hunter, MD; Wallace, DO [address] [place], [place] [address] [phone] Mon-Fri 8:00-5:00 Babies seen by [facility] providers Does NOT accept Medicaid [facility] Brandon, PA; Brecken, PA; Christy, NP; Dees, MD; DeClaire, MD; DeWeese, MD; Hansen, NP; Mills, NP; Parrish, NP; Smoot, NP; Summer, MD; Vapne,  MD [address] [place], [place] [address] [phone] Mon-Fri 8:30-5:00, Sat 10:00-1:00 Providers come to see babies at [facility] Does NOT accept Medicaid Free prenatal information session Tuesdays at 4:45pm [facility] Bouska, MD; Gordon, PA; Jeffery, PA; Weber, PA [address] [place] [address] [phone] Mon-Fri 7:30-5:30 Babies seen by [facility] providers [facility] [address], [place], [place]  [address] [phone]   Fax - [phone]  [place] ([address] & [address]) [facility] [place] [address] [place], [place] [address] [phone] Mon-Thur 8:00-6:00 Providers come to see babies at [facility] Accepting Medicaid [facility] Anderson, NP; Badger, MD; Beal, PA; Spencer, PA [address] [place], [place] [address] [phone] Mon-Thur 7:30-7:30, Fri 7:30-4:30 Babies seen by [facility] providers Accepting Medicaid [facility] Agbuya, MD; Klett, NP; Romgoolam, MD [address], [place], [place] [address] [phone] Mon-Fri 8:30-5:00, Sat 8:30-12:00 Providers come to see babies at [facility] Accepting Medicaid Must have "Meet & Greet" appointment at office prior to delivery [facility] ([facility]) McCord, MD; Wallace, MD; Wood, MD [address], [place], [place] [address] [phone] Mon-Wed 8:00-6:00, Thur-Fri 8:00-5:00, Sat 9:00-12:00 Providers come to see babies at [facility] Does NOT accept Medicaid Only accepting siblings of current patients [facility] [place]  [address], [place], [place]  [address] [phone]   Fax - [phone] [facility] [address], [place], [place]  [address] [phone]   Fax -  [phone]  [place] ([address] & [address]) [facility] Cirigliano, DO; Matthews, DO [address] [place], [place] [address] [phone] Mon-Fri 7:00-5:00 Babies seen by [facility] providers Does NOT accept Medicaid [facility] Briscoe, MD; Howley, PA; Moreira, PA [address], [place], [place] [address] [phone] Mon-Fri 8:00-5:00 Babies seen by [facility] providers Accepting Medicaid [facility] Boyd, MD; Church, PA; Jones, NP; Osborn, PA [address], [place],  [address] [phone] Mon-Fri 8:00-5:00 Babies seen by providers at [facility] Accepting Medicaid  [place] ([address]) [facility] [place] Wendling, DO [address] [place], [place] [address] [phone] Mon-Fri 8:00-5:00 Babies seen by [facility] providers Does NOT accept Medicaid Limited availability, please call early in hospitalization to schedule follow-up [facility] Calderon, PA; Cummings, MD; Dillard, MD; Martin, PA; Olson, MD; VanDeven, PA [address], [place], [place] [address] [phone] Mon-Fri 8:30-5:00, Sat 9:00-12:00 Babies seen by providers at [facility] Accepting Medicaid Please register online then schedule online or call office [email or web address] ([facility] Hunter, NP; Kumar, MD; Martin Rogers, PA [address], [place], [place] [address] [phone] Mon-Fri 8:00-5:00 Babies seen by providers at [facility] Accepting Medicaid [facility] ([facility]) Spalding, MD; Kristi Fleenor, NP; West, MD [address], [place], [place] [address] [phone] Mon-Fri 8:00-5:30, Sat&Sun by appointment (phones open at  8:30) Babies seen by [facility] providers Accepting Medicaid Must be a first-time baby or sibling of current patient [facility] [place]  [address], [place], [place]  [address] [phone]   Fax - [phone]  [place] ([address]) [facility] Brown, PA; Cowen, PA; Rice, MD; Helton, PA; Spry, MD [address] [place], [place] [address] [phone] Mon-Thur 8:00-7:00, Fri 8:00-5:00, Sat 8:00-12:00, Sun 9:00-12:00 Babies seen by [facility] providers Accepting Medicaid [facility] Coe-Goins, MD; Marshall, MD; Pierre-Louis, MD [address], [place], [place] [address] [phone] Mon-Thur 8:00-5:00 Babies seen by providers at [facility] Accepting Medicaid [facility] Bratton, MD; Coe-Goins, MD; Hayes, MD; Lewis, MD; List, MD; Lott, MD; Marshall, MD; Moran, MD; O'Michaeljames Milnes, MD; Pierre-Louis, MD; Pitonzo, MD; Scholer, MD; Spangle, MD [address] [place], [place] [address] [phone] Mon-Fri 8:00-5:30, Sat (Oct.-Mar.) 9:00-1:00 Babies seen by providers at [facility] Accepting Medicaid Must fill out new patient packet, available online at [email or web address] [facility] ([facility]) Friddle, NP; Harris, NP; Kelly, NP; Logan, MD; Melvin, PA; Poth, MD; Ramadoss, MD; Stanton, NP [address], [place], [place] [address] [phone] Mon-Thur 8:00-5:30, Fri 8:00-5:00 Babies seen by providers at [facility] Accepting Medicaid  [place] ([address]) [facility] Dixon, PA; [place], MD; Pickard, MD; Tapia, PA [address], [place], [place] [address] [phone] Mon-Fri 8:00-5:00 Babies seen by providers at [facility] Accepting Medicaid   [place] ([address]) [facility] [place] Masneri, DO; Meyers, MD; Nelson, PA [address], [place], [place] [address] [phone] Mon-Fri 8:00-5:00 Babies seen by providers at [facility] Does NOT accept Medicaid Limited appointment availability, please call early in hospitalization  [facility] [place] Kunedd, DO; McGowen, MD [address], [place], [place] [address] [phone] Mon-Fri 8:00-5:00 Babies seen by [facility] providers Does NOT accept Medicaid [facility] [place] Cameron, MD; MacDonald, MD; Michaels, PA; Nayak, MD [address] [place] [address], [place], [place] [address] [phone] Mon-Fri 8:00-5:00 After hours clinic ([address] [place],  [address]) [phone] Mon-Fri 5:00-8:00, Sat 12:00-6:00, Sun 10:00-4:00 Babies seen by [facility] providers Accepting Medicaid [facility] [place] [address], [place], [place]  [address] [phone]   Fax - [phone]  [place] ([address]) [facility] [facility] Andy, MD [address], [place], [place] [address] [phone] Mon-Fri 8:00-5:00 Babies seen by [facility] providers Does NOT accept Medicaid [facility] [place] ([facility] [place]) Eksir, MD [address], [place], [place] [address] [phone] Mon-Thur 8:00-7:00, Fri 8:00-5:00, Sat 8:00-12:00 Babies seen by providers at [facility] Accepting Medicaid - but does not have vaccinations in office (must be received elsewhere) Limited availability, please call early in hospitalization  [place] ([address]) [facility]  Charlene Flemming, MD [address], [place] [address] [phone]  Fax [phone]  [facility]  Kimberly Newton, MD, Annamarie Streilein, PA, Carla Hampton, PA [address] [place], [place]  [address] [phone] [facility]  [address], [place], [place] [address] [phone] [address], [place], [place] [address] [phone] (West Office)  [facility] [address], [place], [place] [address] [phone] [facility] [address], [place], [place] [address] [phone] [facility] [address], [place], [place] [address] [phone] [facility] [address], [place], [place] [address] [phone] [facility] [address], [place], [place] [address] [phone] [facility] [address], [place], [place] [address] [phone] [facility]  [address], [place], [place] [address] [phone] [facility] [address] [place] [address], [place], [place] [address] [phone] [facility] [address], [place], [place] [address] [phone] [facility] [address], [place],  [address] [phone]  

## 2021-01-08 NOTE — Progress Notes (Signed)
   PRENATAL VISIT NOTE  Subjective:  Kara Monroe is a 30 y.o. [id number] at [redacted]w[redacted]d being seen today for ongoing prenatal care.  She is currently monitored for the following issues for this high-risk pregnancy and has Refugee health examination; Adnexal mass; Intrauterine normal pregnancy; Anemia affecting pregnancy; and Supervision of high risk pregnancy, antepartum on their problem list.  Patient reports no complaints.  Contractions: Not present.  .  Movement: Present. Denies leaking of fluid.   The following portions of the patient's history were reviewed and updated as appropriate: allergies, current medications, past family history, past medical history, past social history, past surgical history and problem list.   Objective:   Vitals:   01/08/21 1406  BP: (!) 91/55  Pulse: (!) 109  Weight: 135 lb (61.2 kg)    Fetal Status: Fetal Heart Rate (bpm): 132 Fundal Height: 38 cm Movement: Present     General:  Alert, oriented and cooperative. Patient is in no acute distress.  Skin: Skin is warm and dry. No rash noted.   Cardiovascular: Normal heart rate noted  Respiratory: Normal respiratory effort, no problems with respiration noted  Abdomen: Soft, gravid, appropriate for gestational age.  Pain/Pressure: Absent     Pelvic: Cervical exam deferred        Extremities: Normal range of motion.  Edema: None  Mental Status: Normal mood and affect. Normal behavior. Normal judgment and thought content.   Assessment and Plan:  Pregnancy: [id number] at [redacted]w[redacted]d 1. Supervision of high risk pregnancy, antepartum - Routine OB. No concerns - I discussed 41 week IOL if patient does not go into spontaneous labor before. Patient prefers spontaneous labor but agreeable to 41 week IOL. - Discussed RRT, RPO, [name] with patient  2. [redacted] weeks gestation of pregnancy   3. Language barrier affecting health care - Kinyarwandan interpretor used for entirety of visit   Term labor symptoms and  general obstetric precautions including but not limited to vaginal bleeding, contractions, leaking of fluid and fetal movement were reviewed in detail with the patient. Please refer to After Visit Summary for other counseling recommendations.   Return in about 1 week (around 01/15/2021).  Future Appointments  Date Time Provider Department Center  01/15/2021  9:15 AM Kathlene Cote [facility]  01/16/2021 11:15 AM [facility]     Brand Males, CNM 01/08/21 7:30 PM

## 2021-01-12 ENCOUNTER — Inpatient Hospital Stay (HOSPITAL_COMMUNITY)
Admission: AD | Admit: 2021-01-12 | Discharge: 2021-01-14 | DRG: 807 | Disposition: A | Discharge status: Home / Self Care | Payer: Medicaid Other | Attending: Obstetrics & Gynecology | Admitting: Obstetrics & Gynecology

## 2021-01-12 ENCOUNTER — Encounter (HOSPITAL_COMMUNITY): Payer: Self-pay | Admitting: Obstetrics & Gynecology

## 2021-01-12 ENCOUNTER — Inpatient Hospital Stay (HOSPITAL_COMMUNITY): Payer: Medicaid Other | Admitting: Anesthesiology

## 2021-01-12 ENCOUNTER — Other Ambulatory Visit: Payer: Self-pay

## 2021-01-12 DIAGNOSIS — D649 Anemia, unspecified: Secondary | ICD-10-CM | POA: Diagnosis not present

## 2021-01-12 DIAGNOSIS — Z3A39 39 weeks gestation of pregnancy: Secondary | ICD-10-CM

## 2021-01-12 DIAGNOSIS — O9081 Anemia of the puerperium: Secondary | ICD-10-CM | POA: Diagnosis not present

## 2021-01-12 DIAGNOSIS — D62 Acute posthemorrhagic anemia: Secondary | ICD-10-CM | POA: Diagnosis present

## 2021-01-12 DIAGNOSIS — Z20822 Contact with and (suspected) exposure to covid-19: Secondary | ICD-10-CM | POA: Diagnosis present

## 2021-01-12 DIAGNOSIS — O99019 Anemia complicating pregnancy, unspecified trimester: Secondary | ICD-10-CM | POA: Diagnosis present

## 2021-01-12 DIAGNOSIS — Z3480 Encounter for supervision of other normal pregnancy, unspecified trimester: Secondary | ICD-10-CM

## 2021-01-12 DIAGNOSIS — N9489 Other specified conditions associated with female genital organs and menstrual cycle: Secondary | ICD-10-CM | POA: Diagnosis present

## 2021-01-12 DIAGNOSIS — O0990 Supervision of high risk pregnancy, unspecified, unspecified trimester: Secondary | ICD-10-CM

## 2021-01-12 DIAGNOSIS — O26893 Other specified pregnancy related conditions, third trimester: Secondary | ICD-10-CM | POA: Diagnosis present

## 2021-01-12 HISTORY — DX: Other specified health status: Z78.9

## 2021-01-12 LAB — TYPE AND SCREEN
ABO/RH(D): O POS
Antibody Screen: NEGATIVE

## 2021-01-12 LAB — CBC
HCT: 34 % — ABNORMAL LOW (ref 36.0–46.0)
Hemoglobin: 10.6 g/dL — ABNORMAL LOW (ref 12.0–15.0)
MCH: 26.1 pg (ref 26.0–34.0)
MCHC: 31.2 g/dL (ref 30.0–36.0)
MCV: 83.7 fL (ref 80.0–100.0)
Platelets: 179 10*3/uL (ref 150–400)
RBC: 4.06 MIL/uL (ref 3.87–5.11)
RDW: 14.6 % (ref 11.5–15.5)
WBC: 6.6 10*3/uL (ref 4.0–10.5)
nRBC: 0 % (ref 0.0–0.2)

## 2021-01-12 LAB — RESP PANEL BY RT-PCR (FLU A&B, COVID) ARPGX2
Influenza A by PCR: NEGATIVE
Influenza B by PCR: NEGATIVE
SARS Coronavirus 2 by RT PCR: NEGATIVE

## 2021-01-12 LAB — RPR: RPR Ser Ql: NONREACTIVE

## 2021-01-12 MED ORDER — FENTANYL-BUPIVACAINE-NACL 0.5-0.125-0.9 MG/250ML-% EP SOLN
12.0000 mL/h | EPIDURAL | Status: DC | PRN
  Administered 2021-01-12: 12 mL/h via EPIDURAL
  Filled 2021-01-12: qty 250

## 2021-01-12 MED ORDER — BENZOCAINE-MENTHOL 20-0.5 % EX AERO: 1.0000 "application " | INHALATION_SPRAY | CUTANEOUS | Status: DC | PRN

## 2021-01-12 MED ORDER — ONDANSETRON HCL 4 MG/2ML IJ SOLN: 4.0000 mg | INTRAMUSCULAR | Status: DC | PRN

## 2021-01-12 MED ORDER — ACETAMINOPHEN 325 MG PO TABS: 650.0000 mg | ORAL_TABLET | ORAL | Status: DC | PRN

## 2021-01-12 MED ORDER — EPHEDRINE 5 MG/ML INJ
10.0000 mg | INTRAVENOUS | Status: DC | PRN
  Filled 2021-01-12: qty 2

## 2021-01-12 MED ORDER — LACTATED RINGERS IV BOLUS
1000.0000 mL | Freq: Once | INTRAVENOUS | Status: AC
  Administered 2021-01-12: 1000 mL via INTRAVENOUS

## 2021-01-12 MED ORDER — OXYCODONE-ACETAMINOPHEN 5-325 MG PO TABS: 2.0000 | ORAL_TABLET | ORAL | Status: DC | PRN

## 2021-01-12 MED ORDER — PRENATAL MULTIVITAMIN CH
1.0000 | ORAL_TABLET | Freq: Every day | ORAL | Status: DC
  Administered 2021-01-13: 1 via ORAL
  Filled 2021-01-12: qty 1

## 2021-01-12 MED ORDER — LIDOCAINE HCL (PF) 1 % IJ SOLN: 30.0000 mL | INTRAMUSCULAR | Status: DC | PRN

## 2021-01-12 MED ORDER — OXYCODONE HCL 5 MG PO TABS: 5.0000 mg | ORAL_TABLET | Freq: Four times a day (QID) | ORAL | Status: DC | PRN

## 2021-01-12 MED ORDER — WITCH HAZEL-GLYCERIN EX PADS: 1.0000 "application " | MEDICATED_PAD | CUTANEOUS | Status: DC | PRN

## 2021-01-12 MED ORDER — COCONUT OIL OIL: 1.0000 "application " | TOPICAL_OIL | Status: DC | PRN

## 2021-01-12 MED ORDER — OXYTOCIN-SODIUM CHLORIDE 30-0.9 UT/500ML-% IV SOLN: 2.5000 [IU]/h | INTRAVENOUS | Status: DC | PRN

## 2021-01-12 MED ORDER — OXYTOCIN BOLUS FROM INFUSION: 333.0000 mL | Freq: Once | INTRAVENOUS | Status: DC

## 2021-01-12 MED ORDER — PHENYLEPHRINE 40 MCG/ML (10ML) SYRINGE FOR IV PUSH (FOR BLOOD PRESSURE SUPPORT)
80.0000 ug | PREFILLED_SYRINGE | INTRAVENOUS | Status: DC | PRN
  Administered 2021-01-12: 80 ug via INTRAVENOUS
  Filled 2021-01-12: qty 10

## 2021-01-12 MED ORDER — LACTATED RINGERS IV SOLN
500.0000 mL | Freq: Once | INTRAVENOUS | Status: AC
  Administered 2021-01-12: 500 mL via INTRAVENOUS

## 2021-01-12 MED ORDER — OXYTOCIN-SODIUM CHLORIDE 30-0.9 UT/500ML-% IV SOLN
2.5000 [IU]/h | INTRAVENOUS | Status: DC
  Filled 2021-01-12: qty 500

## 2021-01-12 MED ORDER — OXYCODONE-ACETAMINOPHEN 5-325 MG PO TABS: 1.0000 | ORAL_TABLET | ORAL | Status: DC | PRN

## 2021-01-12 MED ORDER — LIDOCAINE HCL (PF) 1 % IJ SOLN
INTRAMUSCULAR | Status: DC | PRN
  Administered 2021-01-12: 11 mL via EPIDURAL

## 2021-01-12 MED ORDER — ONDANSETRON HCL 4 MG/2ML IJ SOLN: 4.0000 mg | Freq: Four times a day (QID) | INTRAMUSCULAR | Status: DC | PRN

## 2021-01-12 MED ORDER — IBUPROFEN 600 MG PO TABS
600.0000 mg | ORAL_TABLET | Freq: Four times a day (QID) | ORAL | Status: DC
  Administered 2021-01-12 – 2021-01-14 (×5): 600 mg via ORAL
  Filled 2021-01-12 (×6): qty 1

## 2021-01-12 MED ORDER — LACTATED RINGERS IV SOLN: 500.0000 mL | INTRAVENOUS | Status: DC | PRN

## 2021-01-12 MED ORDER — ONDANSETRON HCL 4 MG PO TABS: 4.0000 mg | ORAL_TABLET | ORAL | Status: DC | PRN

## 2021-01-12 MED ORDER — TETANUS-DIPHTH-ACELL PERTUSSIS 5-2.5-18.5 LF-MCG/0.5 IM SUSY: 0.5000 mL | PREFILLED_SYRINGE | Freq: Once | INTRAMUSCULAR | Status: DC

## 2021-01-12 MED ORDER — DIPHENHYDRAMINE HCL 50 MG/ML IJ SOLN: 12.5000 mg | INTRAMUSCULAR | Status: DC | PRN

## 2021-01-12 MED ORDER — ERYTHROMYCIN 5 MG/GM OP OINT
TOPICAL_OINTMENT | OPHTHALMIC | Status: AC
  Filled 2021-01-12: qty 1

## 2021-01-12 MED ORDER — SOD CITRATE-CITRIC ACID 500-334 MG/5ML PO SOLN: 30.0000 mL | ORAL | Status: DC | PRN

## 2021-01-12 MED ORDER — DIBUCAINE (PERIANAL) 1 % EX OINT: 1.0000 "application " | TOPICAL_OINTMENT | CUTANEOUS | Status: DC | PRN

## 2021-01-12 MED ORDER — SIMETHICONE 80 MG PO CHEW: 80.0000 mg | CHEWABLE_TABLET | ORAL | Status: DC | PRN

## 2021-01-12 MED ORDER — PHENYLEPHRINE 40 MCG/ML (10ML) SYRINGE FOR IV PUSH (FOR BLOOD PRESSURE SUPPORT)
80.0000 ug | PREFILLED_SYRINGE | INTRAVENOUS | Status: DC | PRN
  Filled 2021-01-12 (×2): qty 10

## 2021-01-12 MED ORDER — POLYETHYLENE GLYCOL 3350 17 G PO PACK
17.0000 g | PACK | Freq: Every day | ORAL | Status: DC
  Administered 2021-01-13: 17 g via ORAL
  Filled 2021-01-12: qty 1

## 2021-01-12 MED ORDER — LACTATED RINGERS IV SOLN: INTRAVENOUS | Status: DC

## 2021-01-12 MED ORDER — DIPHENHYDRAMINE HCL 25 MG PO CAPS: 25.0000 mg | ORAL_CAPSULE | Freq: Four times a day (QID) | ORAL | Status: DC | PRN

## 2021-01-12 NOTE — Anesthesia Procedure Notes (Signed)
Epidural Patient location during procedure: OB Start time: 01/12/2021 8:27 AM End time: 01/12/2021 8:50 AM  Staffing Anesthesiologist: Lowella Curb, MD Performed: other anesthesia staff   Preanesthetic Checklist Completed: patient identified, IV checked, site marked, risks and benefits discussed, surgical consent, monitors and equipment checked, pre-op evaluation and timeout performed  Epidural Patient position: sitting Prep: ChloraPrep Patient monitoring: heart rate, cardiac monitor, continuous pulse ox and blood pressure Approach: midline Injection technique: LOR air  Needle:  Needle type: Tuohy  Needle gauge: 17 G Needle length: 9 cm Needle insertion depth: 6 cm Catheter type: closed end flexible Catheter size: 20 Guage Catheter at skin depth: 10 cm Test dose: negative  Assessment Events: blood not aspirated, injection not painful, no injection resistance, no paresthesia and negative IV test  Additional Notes Epidural placed by SRNA under direct supervisionReason for block:procedure for pain

## 2021-01-12 NOTE — H&P (Signed)
Kara Monroe is a 30 y.o. female presenting for Labor. She reports contractions every 5 minutes. + fetal movement. Kinyarwanda speaking. Interpretor used.    OB History     Gravida  4   Para  2   Term  2   Preterm  0   AB  1   Living  2      SAB  0   IAB  0   Ectopic  0   Multiple      Live Births  2        Obstetric Comments  Two prior vaginal deliveries  Had one early miscarriage          History reviewed. No pertinent past medical history. History reviewed. No pertinent surgical history. Family History: family history includes Healthy in her father and mother. Social History:  reports that she has never smoked. She has never used smokeless tobacco. She reports that she does not drink alcohol and does not use drugs.    Maternal Diabetes: No Genetic Screening: Normal Maternal Ultrasounds/Referrals: Normal Fetal Ultrasounds or other Referrals:  None Maternal Substance Abuse:  No Significant Maternal Medications:  None Significant Maternal Lab Results:  None Other Comments:  None  Review of Systems  Constitutional:  Negative for fever.  Gastrointestinal:  Positive for abdominal pain.  Genitourinary:  Negative for vaginal bleeding and vaginal discharge.  History Dilation: 4.5 Effacement (%): 60 Station: -1 Exam by:: Kara Orleans, RN Blood pressure 100/64, pulse (!) 57, temperature 97.9 F (36.6 C), last menstrual period 04/10/2020, SpO2 100 %. Maternal Exam:  Uterine Assessment: Contraction strength is moderate.  Abdomen: Patient reports generalized tenderness.  Cervix: Cervix evaluated by digital exam.    Physical Exam Vitals and nursing note reviewed.  Constitutional:      Appearance: Normal appearance.  HENT:     Head: Normocephalic.  Eyes:     Pupils: Pupils are equal, round, and reactive to light.  Pulmonary:     Effort: Pulmonary effort is normal.  Abdominal:     Tenderness: There is generalized abdominal tenderness.   Genitourinary:    Comments: Dilation: 4.5 Effacement (%): 60 Station: -1 Presentation: Vertex Exam by:: Kara Orleans, RN  Neurological:     Mental Status: She is alert and oriented to person, place, and time.  Psychiatric:        Behavior: Behavior normal.    Prenatal labs: ABO, Rh: O/Positive/-- (10/26 1011) Antibody: Negative (10/26 1011) Rubella: 14.40 (10/26 1011) RPR: Non Reactive (10/26 1011)  HBsAg: Negative (10/26 1011)  HIV: Non Reactive (10/26 1011)  GBS: Negative/-- (11/03 1105)   Assessment/Plan:  Admit for labor GBS negative  Desires epidural  Dr. Ephriam Monroe agreeable to admission for labor. Girl/Breast/bottle  BC/decided.   Kara Monroe 01/12/2021, 6:36 AM

## 2021-01-12 NOTE — MAU Note (Signed)
Contractions every 5 minutes. Denies vaginal bleeding or leaking of fluid. +FM.

## 2021-01-12 NOTE — Plan of Care (Signed)

## 2021-01-12 NOTE — Lactation Note (Signed)
This note was copied from a baby's chart. Lactation Consultation Note  Patient Name: Kara Monroe [id number] Date: 01/12/2021 Reason for consult: L&D Initial assessment;Term Age:32 hours Kinyarwanda Interpreter used Pennie Rushing [phone]  L&D consult with 51 minutes old infant and P3 mother. Congratulated family on newborn. Baby is latched upon arrival. [facility] student offered support pillows and encouraged a reclined position to maintain a deep latch. Baby pops on and off breast after a burst of sucks. Colostrum is easily expressed. Discussed STS as ideal transition for infants after birth. Explained LC services availability during postpartum stay. Thanked family for their time.      Maternal Data Has patient been taught Hand Expression?: No Does the patient have breastfeeding experience prior to this delivery?: Yes How long did the patient breastfeed?: 24 months and 9 months  Feeding Mother's Current Feeding Choice: Breast Milk and Formula  LATCH Score Latch: Repeated attempts needed to sustain latch, nipple held in mouth throughout feeding, stimulation needed to elicit sucking reflex.  Audible Swallowing: None  Type of Nipple: Everted at rest and after stimulation  Comfort (Breast/Nipple): Soft / non-tender  Hold (Positioning): Assistance needed to correctly position infant at breast and maintain latch.  LATCH Score: 6  Interventions Interventions: Assisted with latch;Skin to skin;Hand express;Expressed milk;Education  Discharge WIC Program: Yes  Consult Status Consult Status: Follow-up from L&D Date: 01/13/21 Follow-up type: In-patient    [name] 01/12/2021, 3:42 PM

## 2021-01-12 NOTE — Plan of Care (Signed)
Kara Rauh, RN 

## 2021-01-12 NOTE — Anesthesia Preprocedure Evaluation (Signed)
Anesthesia Evaluation  Patient identified by MRN, date of birth, ID band Patient awake    Reviewed: Allergy & Precautions, H&P , NPO status , Patient's Chart, lab work & pertinent test results  Airway Mallampati: II  TM Distance: >3 FB Neck ROM: Full    Dental no notable dental hx.    Pulmonary neg pulmonary ROS,    Pulmonary exam normal breath sounds clear to auscultation       Cardiovascular negative cardio ROS Normal cardiovascular exam Rhythm:Regular Rate:Normal     Neuro/Psych negative neurological ROS  negative psych ROS   GI/Hepatic negative GI ROS, Neg liver ROS,   Endo/Other  negative endocrine ROS  Renal/GU negative Renal ROS  negative genitourinary   Musculoskeletal negative musculoskeletal ROS (+)   Abdominal   Peds negative pediatric ROS (+)  Hematology negative hematology ROS (+)   Anesthesia Other Findings   Reproductive/Obstetrics (+) Pregnancy                             Anesthesia Physical Anesthesia Plan  ASA: 2  Anesthesia Plan: Epidural   Post-op Pain Management:    Induction:   PONV Risk Score and Plan:   Airway Management Planned:   Additional Equipment:   Intra-op Plan:   Post-operative Plan:   Informed Consent:   Plan Discussed with:   Anesthesia Plan Comments:         Anesthesia Quick Evaluation

## 2021-01-13 LAB — CBC
HCT: 30.4 % — ABNORMAL LOW (ref 36.0–46.0)
Hemoglobin: 9.9 g/dL — ABNORMAL LOW (ref 12.0–15.0)
MCH: 26.9 pg (ref 26.0–34.0)
MCHC: 32.6 g/dL (ref 30.0–36.0)
MCV: 82.6 fL (ref 80.0–100.0)
Platelets: 192 10*3/uL (ref 150–400)
RBC: 3.68 MIL/uL — ABNORMAL LOW (ref 3.87–5.11)
RDW: 14.5 % (ref 11.5–15.5)
WBC: 8.2 10*3/uL (ref 4.0–10.5)
nRBC: 0 % (ref 0.0–0.2)

## 2021-01-13 MED ORDER — FERROUS SULFATE 325 (65 FE) MG PO TABS
325.0000 mg | ORAL_TABLET | ORAL | Status: DC
  Administered 2021-01-13: 325 mg via ORAL
  Filled 2021-01-13: qty 1

## 2021-01-13 NOTE — Lactation Note (Signed)
This note was copied from a baby's chart. Lactation Consultation Note  Patient Name: Kara Monroe ASNKN'L Date: 01/13/2021 Reason for consult: Initial assessment Age:32 hours  Kinyarwanda interpreter used via audio ipad.  P4, Mother has baby latched upon entering.  Baby relatched while LC was in room.   Mother is Ex BF.  Feed on demand with cues.  Goal 8-12+ times per day after first 24 hrs.  Place baby STS if not cueing. No questions or concerns.   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  Provided lactation brochure.     Maternal Data Does the patient have breastfeeding experience prior to this delivery?: Yes  Feeding Mother's Current Feeding Choice: Breast Milk and Formula  LATCH Score Latch: Grasps breast easily, tongue down, lips flanged, rhythmical sucking.  Audible Swallowing: A few with stimulation  Type of Nipple: Everted at rest and after stimulation  Comfort (Breast/Nipple): Soft / non-tender  Hold (Positioning): No assistance needed to correctly position infant at breast.  LATCH Score: 9   Lactation Tools Discussed/Used    Interventions    Discharge    Consult Status Consult Status: Follow-up Date: 01/13/21 Follow-up type: In-patient    Dahlia Byes [facility] 01/13/2021, 1:12 PM

## 2021-01-13 NOTE — Anesthesia Postprocedure Evaluation (Signed)
Anesthesia Post Note  Patient: Scientist, physiological  Procedure(s) Performed: AN AD HOC LABOR EPIDURAL     Patient location during evaluation: Mother Baby Anesthesia Type: Epidural Level of consciousness: awake and alert and oriented Pain management: satisfactory to patient Vital Signs Assessment: post-procedure vital signs reviewed and stable Respiratory status: respiratory function stable Cardiovascular status: stable Postop Assessment: no headache, epidural receding, patient able to bend at knees, no signs of nausea or vomiting, adequate PO intake and able to ambulate Anesthetic complications: no   No notable events documented.  Last Vitals:  Vitals:   01/13/21 0242 01/13/21 0551  BP: (!) 106/58 (!) 92/48  Pulse: 73 75  Resp: 20 20  Temp: 37.1 C 36.7 C  SpO2: 100% 100%    Last Pain:  Vitals:   01/13/21 0800  TempSrc:   PainSc: 0-No pain   Pain Goal: Patients Stated Pain Goal: 0 (01/12/21 0419)                 Karleen Dolphin

## 2021-01-13 NOTE — Progress Notes (Addendum)
POSTPARTUM PROGRESS NOTE  Post Partum Day 1  Subjective:  Kara Monroe is a 30 y.o. [id number] s/p SVD at [redacted]w[redacted]d.  No acute events overnight.  Pt denies problems with ambulating, voiding or po intake.  She denies nausea or vomiting.  Pain is well controlled.  She has had flatus. Lochia Small.   Objective: Blood pressure (!) 92/48, pulse 75, temperature 98 F (36.7 C), temperature source Oral, resp. rate 20, height 5' (1.524 m), weight 60.8 kg, last menstrual period 04/10/2020, SpO2 100 %, unknown if currently breastfeeding.  Physical Exam:  General: alert, cooperative and no distress Chest: no respiratory distress Heart:regular rate, distal pulses intact Abdomen: soft, nontender,  Uterine Fundus: firm, appropriately tender DVT Evaluation: No calf swelling or tenderness Extremities: No edema Skin: warm, dry  Recent Labs    01/12/21 0646 01/13/21 0437  HGB 10.6* 9.9*  HCT 34.0* 30.4*    Assessment/Plan: Kara Monroe is a 30 y.o. [id number] s/p SVD at [redacted]w[redacted]d   PPD#1 - Doing well Contraception: Undecided, wants to discuss with husband first Feeding: Breast Dispo: Plan for discharge tomorrow.  Anemia: On FeSO4 every other day. Baseline during pregnancy ~ 10.1-10.8, Hgb this morning 9.9   LOS: 1 day   Darral Dash, DO 01/13/2021, 7:45 AM    Midwife attestation I have seen and examined this patient and agree with above documentation in the resident's note.   Post Partum Day 1  Kara Monroe is a 30 y.o. [id number] s/p SVD.  Pt denies problems with ambulating, voiding or po intake. Pain is well controlled. Method of Feeding: breast Reports numbness in right thigh above the knee. States the numbness was the entire leg yesterday and feels it is improving.  PE:  Gen: well appearing Heart: reg rate Lungs: normal WOB Fundus firm Ext: soft, no pain, no edema  Assessment: S/p SVD PPD #1 Extremity paraesthesia: walking and voiding w/o problem, likely from  epidural or leg position during pushing. Discussed f/u in office if worsens or doesn't fully resolve  Plan for discharge: tomorrow  Audio interpreter used for encounter Donette Larry, CNM 10:09 AM

## 2021-01-13 NOTE — Plan of Care (Signed)

## 2021-01-13 NOTE — Progress Notes (Signed)
CSW receive consult for transportation needs for upcoming appointment 11/29, 8:50am at [facility] CSW set up transportation with [employer]. [employer] will arrive to home anytime between 8:15- 8:45am. [employer] request MOB to contact them at [phone] once appointment is completed. Via translator CSW provided MOB with this information.   MOB reported, she does not have the funds to purchase infant's car seat.  CSW provided MOB with car seat, and receive infant restraint system release form signature.   Dolores Frame, MSW, LCSW-A Clinical Social Worker- Weekends [phone]

## 2021-01-14 MED ORDER — ACETAMINOPHEN 325 MG PO TABS: 650.0000 mg | ORAL_TABLET | ORAL | 0 refills | Status: DC | PRN

## 2021-01-14 MED ORDER — TETANUS-DIPHTH-ACELL PERTUSSIS 5-2.5-18.5 LF-MCG/0.5 IM SUSY: 0.5000 mL | PREFILLED_SYRINGE | Freq: Once | INTRAMUSCULAR | 0 refills | Status: AC

## 2021-01-14 MED ORDER — IBUPROFEN 600 MG PO TABS: 600.0000 mg | ORAL_TABLET | Freq: Four times a day (QID) | ORAL | 0 refills | Status: DC

## 2021-01-14 NOTE — Progress Notes (Signed)
Interpreter [phone] for discharge of mom and baby.

## 2021-01-14 NOTE — Discharge Summary (Addendum)
Postpartum Discharge Summary    Patient Name: Kara Monroe DOB: [birth year] MRN: [id number]  Date of admission: 01/12/2021 Delivery date:01/12/2021  Delivering provider: Aletha Halim  Date of discharge: 01/14/2021  Admitting diagnosis: Supervision of other normal pregnancy, antepartum [Z34.80] Intrauterine pregnancy: [redacted]w[redacted]d    Secondary diagnosis:  Principal Problem:   Supervision of other normal pregnancy, antepartum Active Problems:   Adnexal mass   Anemia affecting pregnancy   Supervision of high risk pregnancy, antepartum   Discharge diagnosis: Term Pregnancy Delivered                                              Post partum procedures: None Complications: None  Hospital course: Onset of Labor With Vaginal Delivery      30y.o. yo [id number] admitted in Active Labor on 01/12/2021. Patient had an uncomplicated labor course as follows:  Membrane Rupture Time/Date: 2:36 PM ,01/12/2021   Delivery Method:Vaginal, Spontaneous  Episiotomy: None  Lacerations:  1st degree  Patient had an uncomplicated postpartum course.  She is ambulating, tolerating a regular diet, passing flatus, and urinating well. Patient is discharged home in stable condition on 01/14/21.  Newborn Data: Birth date:01/12/2021  Birth time:2:43 PM  Gender:Female  Living status:Living  Apgars:9 ,9  Weight:3460 g   Magnesium Sulfate received: No BMZ received: No Rhophylac:No MMR:No T-DaP:Given prenatally Flu: No Transfusion:No  Physical exam  Vitals:   01/13/21 0551 01/13/21 1314 01/13/21 2052 01/14/21 0538  BP: (!) 92/48 99/64 103/72 97/70  Pulse: 75 66 78 87  Resp: '20 18 17 20  ' Temp: 98 F (36.7 C) 98 F (36.7 C) 99.3 F (37.4 C) 98.4 F (36.9 C)  TempSrc: Oral Oral Oral Oral  SpO2: 100%  100% 100%  Weight:      Height:       General: alert, cooperative, and no distress Lochia: appropriate Uterine Fundus: firm DVT Evaluation: No evidence of DVT seen on physical  exam. No cords or calf tenderness. No significant calf/ankle edema. Labs: Lab Results  Component Value Date   WBC 8.2 01/13/2021   HGB 9.9 (L) 01/13/2021   HCT 30.4 (L) 01/13/2021   MCV 82.6 01/13/2021   PLT 192 01/13/2021   CMP Latest Ref Rng & Units 10/04/2019  Glucose 65 - 99 mg/dL 73  BUN 6 - 20 mg/dL 5(L)  Creatinine 0.57 - 1.00 mg/dL 0.47(L)  Sodium 134 - 144 mmol/L 138  Potassium 3.5 - 5.2 mmol/L 4.0  Chloride 96 - 106 mmol/L 106  CO2 20 - 29 mmol/L 19(L)  Calcium 8.7 - 10.2 mg/dL 9.3  Total Protein 6.0 - 8.5 g/dL 6.8  Total Bilirubin 0.0 - 1.2 mg/dL 0.5  Alkaline Phos 48 - 121 IU/L 59  AST 0 - 40 IU/L 27  ALT 0 - 32 IU/L 21   Edinburgh Score: Edinburgh Postnatal Depression Scale Screening Tool 01/12/2021  I have been able to laugh and see the funny side of things. 0  I have looked forward with enjoyment to things. 0  I have blamed myself unnecessarily when things went wrong. 0  I have been anxious or worried for no good reason. 0  I have felt scared or panicky for no good reason. 0  Things have been getting on top of me. 0  I have been so unhappy that I have had difficulty sleeping.  0  I have felt sad or miserable. 0  I have been so unhappy that I have been crying. 0  The thought of harming myself has occurred to me. 0  Edinburgh Postnatal Depression Scale Total 0     After visit meds:  Allergies as of 01/14/2021   No Known Allergies      Medication List     STOP taking these medications    polyethylene glycol 17 g packet Commonly known as: MiraLax       TAKE these medications    acetaminophen 325 MG tablet Commonly known as: Tylenol Take 2 tablets (650 mg total) by mouth every 4 (four) hours as needed (for pain scale < 4).   ferrous sulfate 324 (65 Fe) MG Tbec Take 1 tablet (325 mg total) by mouth every other day.   folic acid 216 MCG tablet Commonly known as: FOLVITE Take 1 tablet (400 mcg total) by mouth daily.   ibuprofen 600 MG  tablet Commonly known as: ADVIL Take 1 tablet (600 mg total) by mouth every 6 (six) hours.   Prenatal Vitamin 27-0.8 MG Tabs Take 1 tablet by mouth daily.   Tdap 5-2.5-18.5 LF-MCG/0.5 injection Commonly known as: BOOSTRIX Inject 0.5 mLs into the muscle once for 1 dose.         Discharge home in stable condition Infant Feeding: Breast Infant Disposition:home with mother Discharge instruction: per After Visit Summary and Postpartum booklet. Activity: Advance as tolerated. Pelvic rest for 6 weeks.  Diet: routine diet Future Appointments: Future Appointments  Date Time Provider [place]  01/15/2021  9:15 AM Danielle Rankin [facility]  01/16/2021 11:15 AM [facility]   Follow up Visit:   Please schedule this patient for a In person postpartum visit in 4 weeks with the following provider: Any provider. Additional Postpartum F/U: Anemia, Hgb check   Low risk pregnancy complicated by:  anemia Delivery mode:  Vaginal, Spontaneous  Anticipated Birth Control:  Unsure  01/14/2021 Orvis Brill, DO  CNM attestation I have seen and examined this patient and agree with above documentation in the resident's note.   Ja Pistole is a 30 y.o. [phone] s/p vag del.   Pain is well controlled.  Plan for birth control is  unsure .  Method of Feeding: breast  PE:  BP 97/70 (BP Location: Left Arm)   Pulse 87   Temp 98.4 F (36.9 C) (Oral)   Resp 20   Ht 5' (1.524 m)   Wt 60.8 kg   LMP 04/10/2020 (Exact Date)   SpO2 100%   Breastfeeding Unknown   BMI 26.17 kg/m  Fundus firm  Recent Labs    01/12/21 0646 01/13/21 0437  HGB 10.6* 9.9*  HCT 34.0* 30.4*     Plan: discharge today - postpartum care discussed - f/u clinic in 4-6 weeks for postpartum visit   Myrtis Ser, CNM 9:01 AM 01/14/2021

## 2021-01-15 ENCOUNTER — Encounter: Payer: Medicaid Other | Admitting: Medical

## 2021-01-16 ENCOUNTER — Other Ambulatory Visit: Payer: Medicaid Other

## 2021-01-17 ENCOUNTER — Encounter: Payer: Self-pay | Admitting: Advanced Practice Midwife

## 2021-01-25 ENCOUNTER — Telehealth (HOSPITAL_COMMUNITY): Payer: Self-pay | Admitting: *Deleted

## 2021-01-25 NOTE — Telephone Encounter (Signed)
Mom reports feeling good per interpreter. No concerns about herself at this time. EPDS=0 [facility] score=0) Mom reports baby is doing well. Feeding, peeing, and pooping without difficulty. Safe sleep reviewed. Mom reports no concerns about baby at present.  Duffy Rhody, RN 01-25-2021 at 1:53pm

## 2021-02-03 NOTE — Progress Notes (Signed)
CSW was train/told by colleagues that the car seats provided by [employer] does not come with and/or require bases. If this information is questionable you may contact CSW's supervisor Raymondo Band at [phone] regarding safety of car seats provided.    Dolores Frame, MSW, [employer]  [facility]  Clinical Social Worker I-Weekends [phone]

## 2021-02-14 ENCOUNTER — Other Ambulatory Visit: Payer: Self-pay

## 2021-02-14 ENCOUNTER — Ambulatory Visit (INDEPENDENT_AMBULATORY_CARE_PROVIDER_SITE_OTHER): Payer: Medicaid Other | Admitting: Certified Nurse Midwife

## 2021-02-14 ENCOUNTER — Encounter: Payer: Self-pay | Admitting: Certified Nurse Midwife

## 2021-02-14 NOTE — Progress Notes (Addendum)
Post Partum Visit Note  Kara Monroe is a 30 y.o. [phone] female who presents for a postpartum visit. She is 5 weeks postpartum following a normal spontaneous vaginal delivery.  I have fully reviewed the prenatal and intrapartum course. The delivery was at 39.4 gestational weeks.  Anesthesia: epidural. Postpartum course has been uncomplicated. Baby is doing well. Baby is feeding by breast. Bleeding no bleeding. Bowel function is normal. Bladder function is normal. Patient is not sexually active. Contraception method is none. Postpartum depression screening: negative.   Upstream - 02/14/21 1706       Pregnancy Intention Screening   Does the patient want to become pregnant in the next year? No    Does the patient's partner want to become pregnant in the next year? Unsure    Would the patient like to discuss contraceptive options today? No      Contraception Wrap Up   Current Method Abstinence    End Method FAM or LAM    Contraception Counseling Provided Yes            The pregnancy intention screening data noted above was reviewed. Potential methods of contraception were discussed. The patient elected to proceed with FAM or LAM.   Edinburgh Postnatal Depression Scale - 02/14/21 1534       Edinburgh Postnatal Depression Scale:  In the Past 7 Days   I have been able to laugh and see the funny side of things. 0    I have looked forward with enjoyment to things. 0    I have blamed myself unnecessarily when things went wrong. 0    I have been anxious or worried for no good reason. 0    I have felt scared or panicky for no good reason. 0    Things have been getting on top of me. 0    I have been so unhappy that I have had difficulty sleeping. 0    I have felt sad or miserable. 0    I have been so unhappy that I have been crying. 0    The thought of harming myself has occurred to me. 0    Edinburgh Postnatal Depression Scale Total 0            Health Maintenance Due   Topic Date Due   COVID-19 Vaccine (3 - Booster for Pfizer series) 04/07/2020   The following portions of the patient's history were reviewed and updated as appropriate: allergies, current medications, past family history, past medical history, past social history, past surgical history, and problem list.  Review of Systems Pertinent items noted in HPI and remainder of comprehensive ROS otherwise negative.  Objective:  BP 100/67    Pulse 77    Wt 134 lb 1.6 oz (60.8 kg)    BMI 26.19 kg/m    General:  alert, cooperative, appears stated age, and no distress   Breasts:  normal  Lungs: Normal effort  Heart:  regular rate and rhythm  Abdomen: Soft, non-tender    Wound N/A  GU exam:  not indicated       Assessment:  Postpartum care and examination of lactating mother  Language barrier [name]) - Interpreter present throughout visit (Jocelyn) Plan:   Essential components of care per ACOG recommendations:  1.  Mood and well being: Patient with negative depression screening today. Reviewed local resources for support.  - Patient tobacco use? No.   - hx of drug use? No.    2. Infant care  and feeding:  -Patient currently breastmilk feeding? Yes. Reviewed importance of draining breast regularly to support lactation.  -Social determinants of health (SDOH) reviewed in EPIC. No concerns  3. Sexuality, contraception and birth spacing - Patient does not want a pregnancy in the next year.  Desired family size is 3 children.  - Reviewed forms of contraception in tiered fashion. Patient desired natural family planning (NFP) today.   - Discussed birth spacing of 18 months  4. Sleep and fatigue -Encouraged family/partner/community support of 4 hrs of uninterrupted sleep to help with mood and fatigue  5. Physical Recovery  - Discussed patients delivery and complications. She describes her labor as good. - Patient had a Vaginal, no problems at delivery. Patient had  no  laceration.  Perineal healing reviewed. Patient expressed understanding - Patient has urinary incontinence? No. - Patient is safe to resume physical and sexual activity  6.  Health Maintenance - HM due items addressed Yes - Last pap smear  Diagnosis  Date Value Ref Range Status  10/04/2019   Final   - Negative for intraepithelial lesion or malignancy (NILM)   Pap smear not done at today's visit.  -Breast Cancer screening indicated? No.   7. Chronic Disease/Pregnancy Condition follow up: None - PCP follow up  Bernerd Limbo, CNM Center for [employer], [facility]

## 2021-02-28 ENCOUNTER — Encounter: Payer: Self-pay | Admitting: *Deleted

## 2021-05-23 ENCOUNTER — Ambulatory Visit: Payer: Medicaid Other | Admitting: Family Medicine

## 2021-05-27 NOTE — Progress Notes (Deleted)
? ? ? ?  SUBJECTIVE:  ? ?CHIEF COMPLAINT / HPI:  ? ?Kara Monroe is a 31 y.o. female presents for birth control counseling ? ?A Kinyarwanda speaking interpretor was used for this encounter.   ? ?Contraceptive Counseling ?Sexually active: *** ?LMP: *** ?Intercourse in the last 2 weeks: *** ?Attempting to conceive: *** ?Requests STI testing: *** ?History of STI: *** ?Last Pap: *** ?Personal or family history of DVT/PE: *** ? ?*** ? ?Flowsheet Row Initial Prenatal from 12/13/2020 in Center for [employer] [facility] at [facility]  ?PHQ-9 Total Score 0  ? ?  ?  ? ?Health Maintenance Due  ?Topic  ? COVID-19 Vaccine (3 - Booster for [employer] series)  ? ?  ? ?PERTINENT  PMH / PSH:  ? ?OBJECTIVE:  ? ?There were no vitals taken for this visit.  ? ?General: Alert, no acute distress ?Cardio: Normal S1 and S2, RRR, no r/m/g ?Pulm: CTAB, normal work of breathing ?Abdomen: Bowel sounds normal. Abdomen soft and non-tender.  ?Extremities: No peripheral edema.  ?Neuro: Cranial nerves grossly intact  ? ?ASSESSMENT/PLAN:  ? ?No problem-specific Assessment & Plan notes found for this encounter. ?  ? ?Lattie Haw, MD PGY-3 ?[place]  ?

## 2021-05-28 ENCOUNTER — Ambulatory Visit: Payer: Medicaid Other

## 2021-09-19 IMAGING — US US PELVIS COMPLETE WITH TRANSVAGINAL
1 series · 15 of 25 positions shown · non-contrast
Comparison: None available.

CLINICAL DATA: Initial evaluation for chronic pelvic pain.



[Series 1: us pelvis complete with transvaginal · 121 acquisitions, 15 frames shown]
[im 1/121]
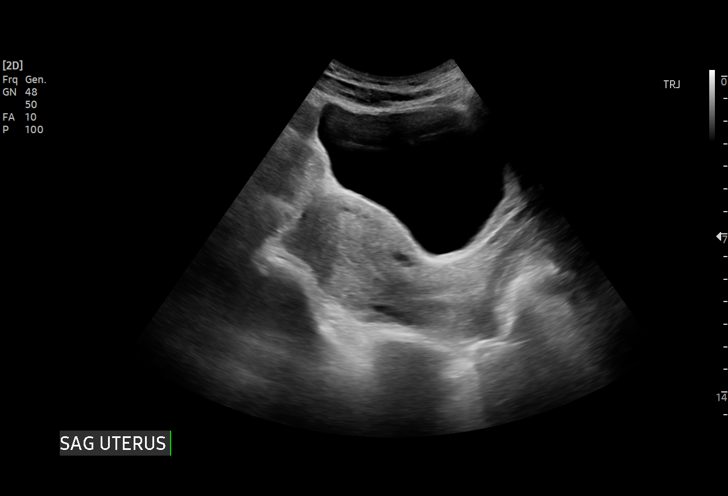
[im 11/121]
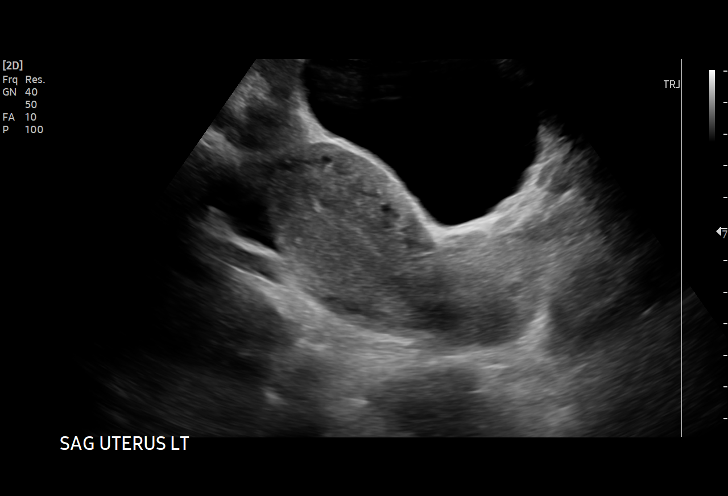
[im 21/121]
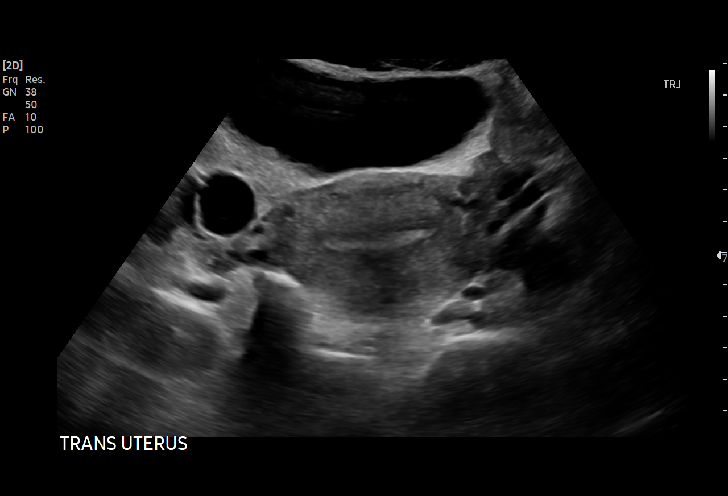
[im 26/121]
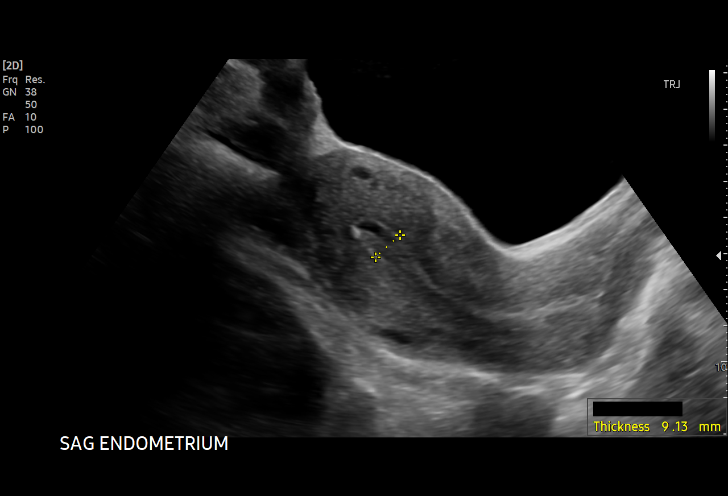
[im 36/121]
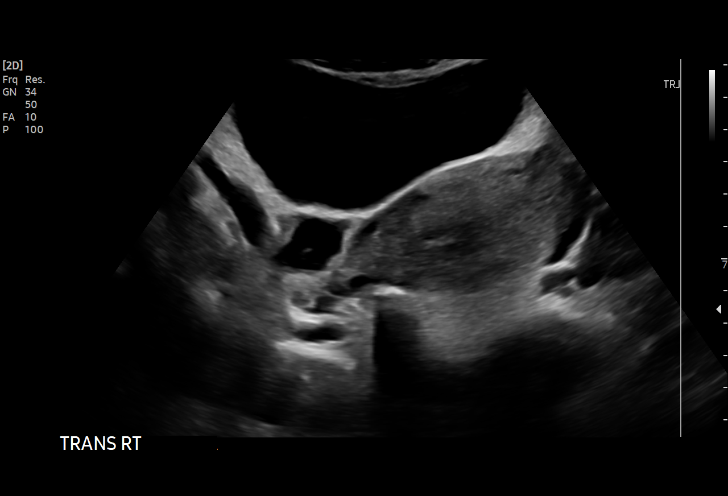
[im 46/121]
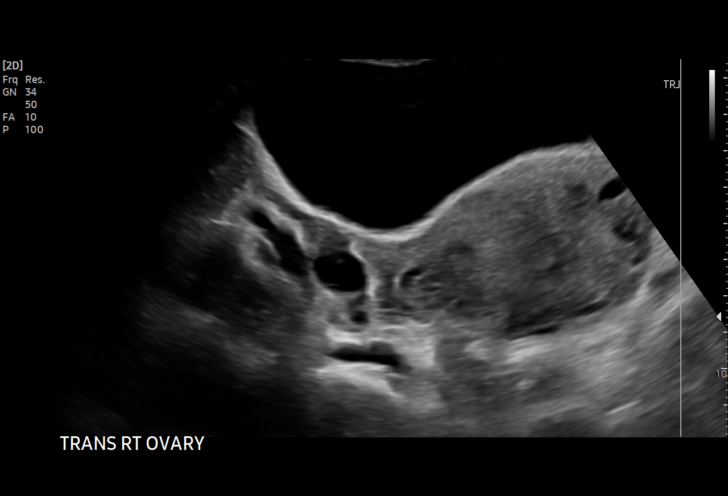
[im 51/121]
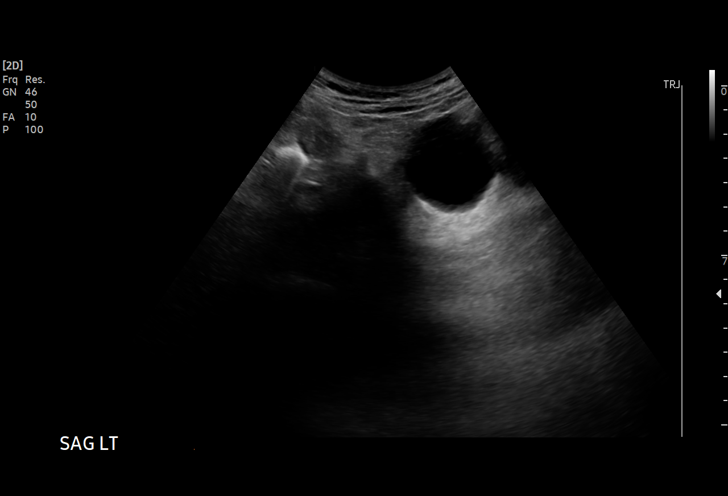
[im 61/121]
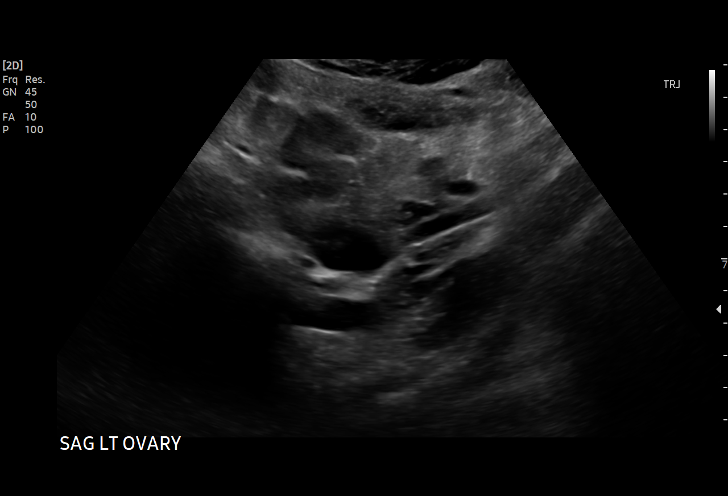
[im 71/121]
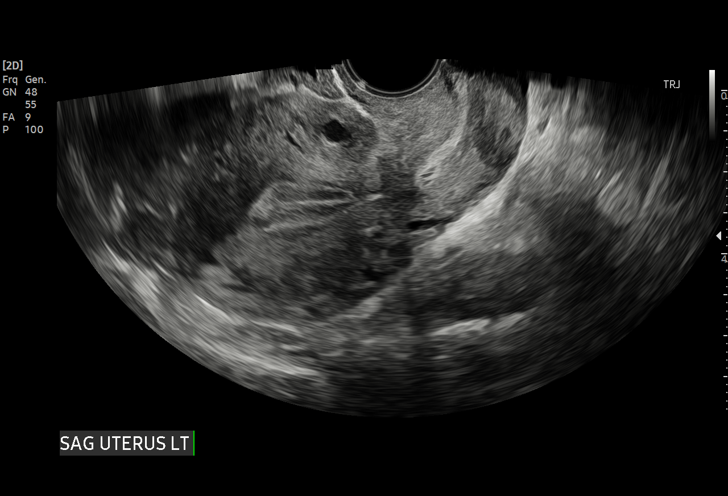
[im 76/121]
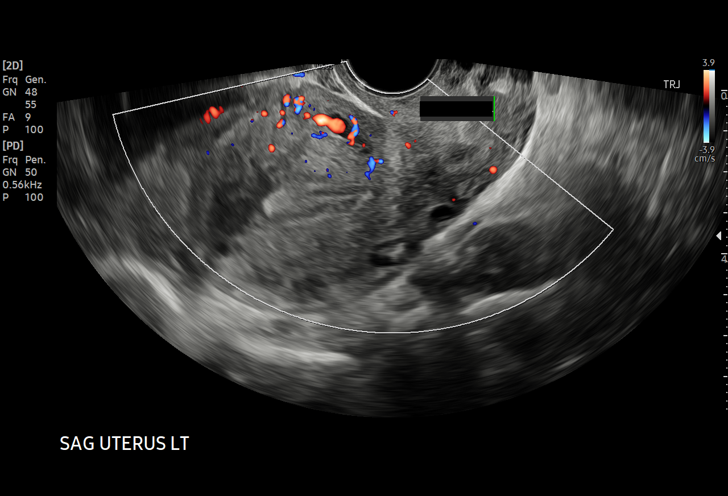
[im 86/121]
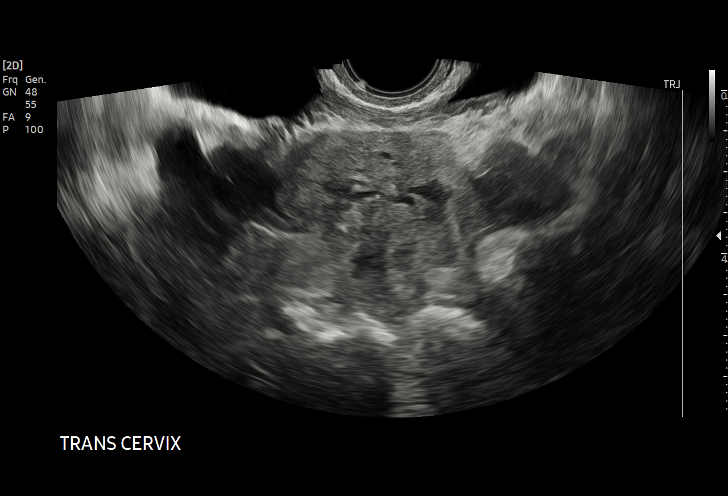
[im 96/121]
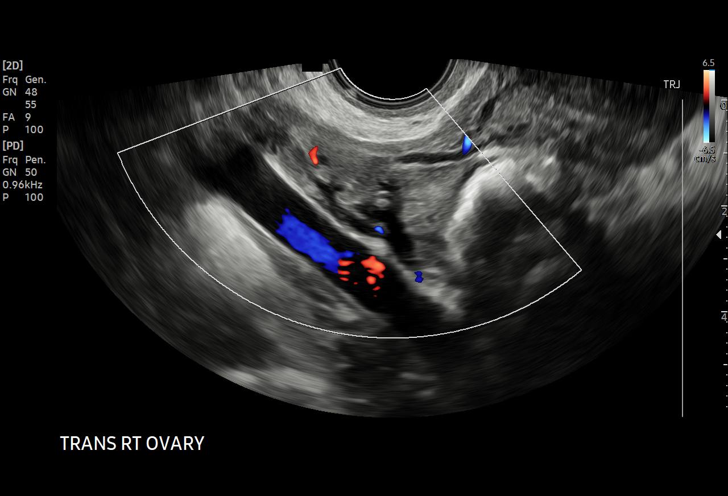
[im 101/121]
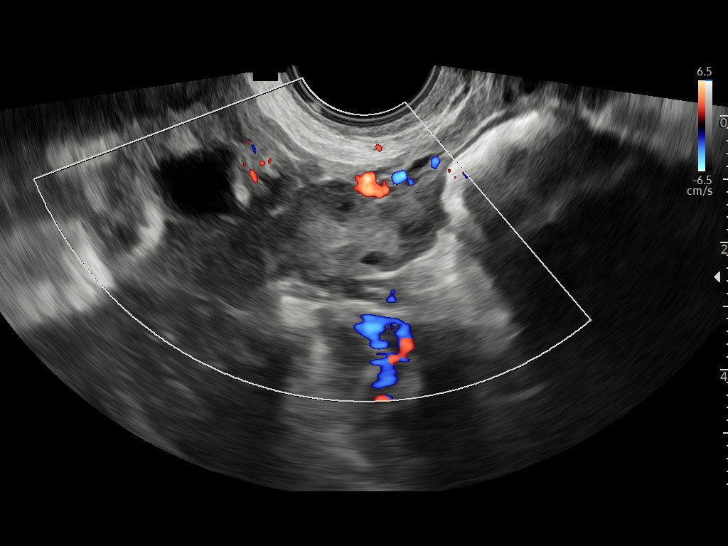
[im 111/121]
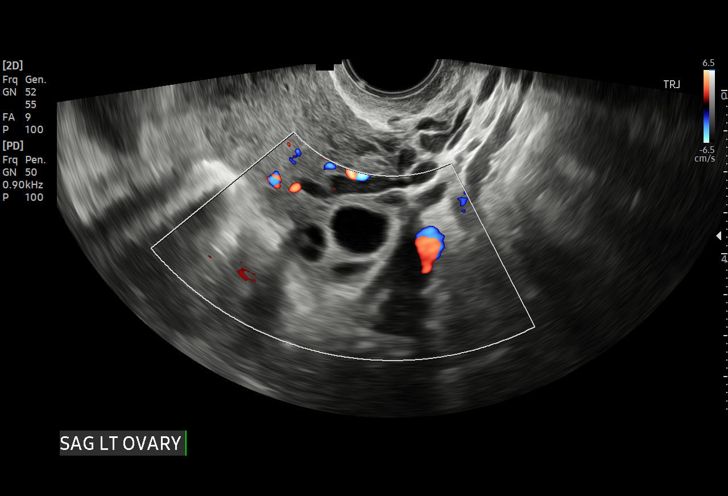
[im 121/121]
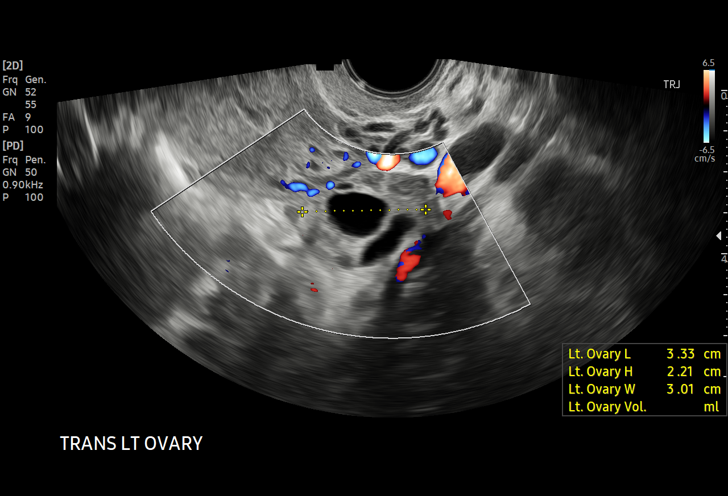

[15 of 25 positions shown; findings below may reference images not displayed]

FINDINGS: Uterus

Measurements: 9.9 x 6.2 x 4.3 cm = volume: 137.8 mL. Uterus is
anteverted. No discrete fibroid or other mass.

Endometrium

Thickness: 9.9 mm.  No focal abnormality visualized.

Right ovary

Measurements: 3.2 x 1.1 x 2.6 cm = volume: 5.8 mL. Two adjacent
simple cysts versus single cyst with internal septation seen within
the right adnexa, measuring 2 cm and 1.3 cm respectively (3.2 cm in
total). No significant internal complexity, vascularity, or solid
component. These appear paraovarian in location, separate from the
adjacent right ovary.

Left ovary

Measurements: 3.3 x 2.2 x 3.0 cm = volume: 11.6 mL. Normal
appearance/no adnexal mass.

Other findings

No abnormal free fluid.
IMPRESSION: 1. Two adjacent simple cysts versus single cyst with internal
septation within the right adnexa, measuring up to 3.2 cm in total.
These appear paraovarian in location, separate from the adjacent
right ovary. While these are almost certainly benign, a short
interval follow-up ultrasound in 6-12 weeks could be performed for
further evaluation as clinically warranted.
2. Otherwise unremarkable and normal pelvic ultrasound.

## 2022-01-30 ENCOUNTER — Ambulatory Visit (INDEPENDENT_AMBULATORY_CARE_PROVIDER_SITE_OTHER): Payer: Medicaid Other

## 2022-01-30 DIAGNOSIS — Z3201 Encounter for pregnancy test, result positive: Secondary | ICD-10-CM | POA: Diagnosis not present

## 2022-01-30 DIAGNOSIS — Z3200 Encounter for pregnancy test, result unknown: Secondary | ICD-10-CM

## 2022-01-30 LAB — POCT PREGNANCY, URINE: Preg Test, Ur: POSITIVE — AB

## 2022-01-30 NOTE — Progress Notes (Signed)
Possible Pregnancy  Called patient with [facility] [phone]. Here today for pregnancy confirmation. UPT in office today is positive. Pt reports she has not taken an at home pregnancy test. This is her first positive test. Reviewed dating with patient:   LMP: 10/31/2021 EDD: 08/07/2022 13w 0d today  OB history reviewed. Reviewed medications and allergies with patient. Recommended pt begin prenatal vitamin and schedule prenatal care.  Janeece Agee, RN 01/30/2022  11:45 AM

## 2022-02-18 NOTE — L&D Delivery Note (Signed)
Delivery Note 32 y.o. [id number] at [redacted]w[redacted]d admitted for IOL for post dates.   At 6:00 AM a viable female was delivered via Vaginal, Spontaneous (Presentation: Left Occiput Anterior).  APGAR: 8, 9; weight TBD.   Placenta status: Spontaneous, Intact.  Cord: 3 vessels with the following complications: None.  Cord pH: not collected.  Anesthesia: Local Episiotomy: None Lacerations: 1st degree;Perineal Suture Repair: 3.0 Monocryl Est. Blood Loss (mL): 86  Mom to postpartum.  Baby to Couplet care / Skin to Skin.  Sheppard Evens MD MPH OB Fellow, Faculty Practice [facility], Center for [facility] 08/15/2022

## 2022-02-20 ENCOUNTER — Ambulatory Visit (INDEPENDENT_AMBULATORY_CARE_PROVIDER_SITE_OTHER): Payer: Medicaid Other

## 2022-02-20 ENCOUNTER — Other Ambulatory Visit (HOSPITAL_COMMUNITY)
Admission: RE | Admit: 2022-02-20 | Discharge: 2022-02-20 | Disposition: A | Discharge status: Home / Self Care | Payer: Medicaid Other | Source: Ambulatory Visit | Attending: Family Medicine | Admitting: Family Medicine

## 2022-02-20 VITALS — BP 101/64 | Wt 146.2 lb

## 2022-02-20 DIAGNOSIS — Z3480 Encounter for supervision of other normal pregnancy, unspecified trimester: Secondary | ICD-10-CM

## 2022-02-20 MED ORDER — PRENATAL PLUS 27-1 MG PO TABS: 1.0000 | ORAL_TABLET | Freq: Every day | ORAL | 11 refills | Status: DC

## 2022-02-20 NOTE — Progress Notes (Signed)
New OB Intake  I connected withNAME@ on 02/20/22 at 10:15 AM EST by In Person Visit and verified that I am speaking with the correct person using two identifiers. Nurse is located at [facility] and pt is located at [facility]  I discussed the limitations, risks, security and privacy concerns of performing an evaluation and management service by telephone and the availability of in person appointments. I also discussed with the patient that there may be a patient responsible charge related to this service. The patient expressed understanding and agreed to proceed.  I explained I am completing New OB Intake today. We discussed EDD of 08/07/22 that is based on LMP of 10/1321. Pt is G5/P3. I reviewed her allergies, medications, Medical/Surgical/OB history, and appropriate screenings. I informed her of [facility] services. [facility] information placed in AVS. Based on history, this is a low risk pregnancy.  There are no problems to display for this patient.   Concerns addressed today  Delivery Plans Plans to deliver at [facility]. Patient given information for [facility] Healthy Baby website for more information about Women's and [place]. Patient is not interested in water birth. Offered upcoming OB visit with CNM to discuss further.  MyChart/Babyscripts MyChart access verified. I explained pt will have some visits in office and some virtually. Babyscripts instructions given and order placed. Patient verifies receipt of registration text/e-mail. Account successfully created and app downloaded.  Blood Pressure Cuff/Weight Scale Blood pressure cuff ordered for patient to pick-up from [employer]. Explained after first prenatal appt pt will check weekly and document in 34. Patient does have weight scale.  Anatomy US Explained first scheduled [place] will be around 19 weeks. Anatomy US scheduled for 03/18/22 at 0930a. Pt notified to arrive at 0915a.  Labs Discussed Johnsie Cancel genetic screening with patient.  Would like both Panorama and Horizon drawn at new OB visit. Routine prenatal labs needed.  COVID Vaccine Patient has had COVID vaccine.   Is patient a CenteringPregnancy candidate?  Not a Candidate Declined due to  NA Not a candidate due to Language barrier If accepted,    Is patient a Mom+Baby Combined Care candidate?  Not a candidate   If accepted, Mom+Baby staff notified  Social Determinants of Health Food Insecurity: Patient expresses food insecurity. Food Market information given to patient; explained patient may visit at the end of first OB appointment. WIC Referral: Patient is interested in referral to [facility].  Transportation: Patient denies transportation needs. Childcare: Discussed no children allowed at ultrasound appointments. Offered childcare services; patient declines childcare services at this time.  First visit review I reviewed new OB appt with patient. I explained they will have a provider visit that includes . Explained pt will be seen by Dr. Kennon Rounds at first visit; encounter routed to appropriate provider. Explained that patient will be seen by pregnancy navigator following visit with provider.   Bethanne Ginger, [place] 02/20/2022  10:36 AM

## 2022-02-21 LAB — GC/CHLAMYDIA PROBE AMP (~~LOC~~) NOT AT ARMC
Chlamydia: NEGATIVE
Comment: NEGATIVE
Comment: NORMAL
Neisseria Gonorrhea: NEGATIVE

## 2022-02-22 LAB — CBC/D/PLT+RPR+RH+ABO+RUBIGG...
Antibody Screen: NEGATIVE
Basophils Absolute: 0 10*3/uL (ref 0.0–0.2)
Basos: 0 %
EOS (ABSOLUTE): 0.2 10*3/uL (ref 0.0–0.4)
Eos: 5 %
HCV Ab: NONREACTIVE
HIV Screen 4th Generation wRfx: NONREACTIVE
Hematocrit: 34.9 % (ref 34.0–46.6)
Hemoglobin: 11.4 g/dL (ref 11.1–15.9)
Hepatitis B Surface Ag: NEGATIVE
Immature Grans (Abs): 0 10*3/uL (ref 0.0–0.1)
Immature Granulocytes: 0 %
Lymphocytes Absolute: 1.2 10*3/uL (ref 0.7–3.1)
Lymphs: 25 %
MCH: 30.1 pg (ref 26.6–33.0)
MCHC: 32.7 g/dL (ref 31.5–35.7)
MCV: 92 fL (ref 79–97)
Monocytes Absolute: 0.5 10*3/uL (ref 0.1–0.9)
Monocytes: 11 %
Neutrophils Absolute: 2.7 10*3/uL (ref 1.4–7.0)
Neutrophils: 59 %
Platelets: 248 10*3/uL (ref 150–450)
RBC: 3.79 x10E6/uL (ref 3.77–5.28)
RDW: 13.8 % (ref 11.7–15.4)
RPR Ser Ql: NONREACTIVE
Rh Factor: POSITIVE
Rubella Antibodies, IGG: 17.2 index (ref >0.99)
WBC: 4.7 10*3/uL (ref 3.4–10.8)

## 2022-02-22 LAB — AFP, SERUM, OPEN SPINA BIFIDA
AFP MoM: 0.94
AFP Value: 32.5 ng/mL
Gest. Age on Collection Date: 16 weeks
Maternal Age At EDD: 34.4 yr
OSBR Risk 1 IN: 10000
Test Results:: NEGATIVE
Weight: 146 [lb_av]

## 2022-02-22 LAB — CULTURE, OB URINE

## 2022-02-22 LAB — URINE CULTURE, OB REFLEX: Organism ID, Bacteria: NO GROWTH

## 2022-02-22 LAB — HCV INTERPRETATION

## 2022-02-24 LAB — PANORAMA PRENATAL TEST FULL PANEL:PANORAMA TEST PLUS 5 ADDITIONAL MICRODELETIONS: FETAL FRACTION: 17.1

## 2022-03-08 ENCOUNTER — Ambulatory Visit (INDEPENDENT_AMBULATORY_CARE_PROVIDER_SITE_OTHER): Payer: Medicaid Other | Admitting: Family Medicine

## 2022-03-08 ENCOUNTER — Encounter: Payer: Self-pay | Admitting: Family Medicine

## 2022-03-08 VITALS — BP 103/67 | HR 84 | Wt 146.1 lb

## 2022-03-08 DIAGNOSIS — Z3480 Encounter for supervision of other normal pregnancy, unspecified trimester: Secondary | ICD-10-CM | POA: Diagnosis not present

## 2022-03-08 DIAGNOSIS — Z3A18 18 weeks gestation of pregnancy: Secondary | ICD-10-CM

## 2022-03-08 NOTE — Progress Notes (Signed)
Subjective:   Kara Monroe is a 32 y.o. [id number] at [redacted]w[redacted]d by LMP being seen today for her first obstetrical visit.  Her obstetrical history is not significant. Patient does intend to breast feed. Pregnancy history fully reviewed.  Patient reports no complaints.  HISTORY: OB History  Gravida Para Term Preterm AB Living  5 3 3  0 1 3  SAB IAB Ectopic Multiple Live Births  1 0 0 0 3    # Outcome Date GA Lbr Len/2nd Weight Sex Delivery Anes PTL Lv  5 Current           4 Term 01/12/21 [redacted]w[redacted]d 03:31 / 05:12 7 lb 10.1 oz (3.46 kg) F Vag-Spont EPI  LIV     Name: TYRESHIA, INGMAN     Apgar1: 9  Apgar5: 9  3 Term 11/14/12 [redacted]w[redacted]d  8 lb 13.1 oz (4 kg) M Vag-Spont  N LIV  2 Term 07/05/11 [redacted]w[redacted]d  7 lb 11.5 oz (3.5 kg) F Vag-Spont   LIV  1 SAB             Obstetric Comments     Last pap smear was  09/2019 and was normal Past Medical History:  Diagnosis Date   Acute blood loss anemia 11/18/2020   Hgb 10.1 on 9/26. Started on FeSO4 every other day.    Medical history non-contributory    [facility] health examination 08/31/2019   [place] date was 04/27/19 Normal overall    Past Surgical History:  Procedure Laterality Date   NO PAST SURGERIES     Family History  Problem Relation Age of Onset   Healthy Mother    Hypertension Father    Heart disease Father    Social History   Tobacco Use   Smoking status: Never   Smokeless tobacco: Never  Substance Use Topics   Alcohol use: Never   Drug use: Never   No Known Allergies Current Outpatient Medications on File Prior to Visit  Medication Sig Dispense Refill   prenatal vitamin w/FE, FA (PRENATAL 1 + 1) 27-1 MG TABS tablet Take 1 tablet by mouth daily at 12 noon. (Patient not taking: Reported on 03/08/2022) 30 tablet 11   No current facility-administered medications on file prior to visit.     Exam   Vitals:   03/08/22 1014  BP: 103/67  Pulse: 84  Weight: 146 lb 1.6 oz (66.3 kg)   Fetal Heart Rate  (bpm): 151  System: General: well-developed, well-nourished female in no acute distress   Skin: normal coloration and turgor, no rashes   Neurologic: oriented, normal, negative, normal mood   Extremities: normal strength, tone, and muscle mass, ROM of all joints is normal   HEENT PERRLA, extraocular movement intact and sclera clear, anicteric   Mouth/Teeth mucous membranes moist, pharynx normal without lesions and dental hygiene good   Neck supple and no masses   Cardiovascular: regular rate and rhythm   Respiratory:  no respiratory distress, normal breath sounds   Abdomen: soft, non-tender; bowel sounds normal; no masses,  no organomegaly     Assessment:   Pregnancy: [id number] Patient Active Problem List   Diagnosis Date Noted   Supervision of other normal pregnancy, antepartum 02/20/2022     Plan:  1. Supervision of other normal pregnancy, antepartum Normal genetics and AFP Labs reviewed Scheduled for anatomy u/s   Initial labs drawn. Continue prenatal vitamins. Genetic Screening discussed, NIPS: results reviewed. Ultrasound discussed; fetal anatomic survey: ordered. Problem list  reviewed and updated. The nature of [place] with multiple MDs and other Advanced Practice Providers was explained to patient; also emphasized that residents, students are part of our team. Routine obstetric precautions reviewed. Return in 4 weeks (on 04/05/2022).

## 2022-03-18 ENCOUNTER — Ambulatory Visit: Payer: Medicaid Other

## 2022-03-28 ENCOUNTER — Other Ambulatory Visit: Payer: Self-pay | Admitting: *Deleted

## 2022-03-28 DIAGNOSIS — Z3480 Encounter for supervision of other normal pregnancy, unspecified trimester: Secondary | ICD-10-CM

## 2022-04-01 ENCOUNTER — Other Ambulatory Visit: Payer: Self-pay | Admitting: Family Medicine

## 2022-04-01 ENCOUNTER — Ambulatory Visit: Payer: Medicaid Other

## 2022-04-01 ENCOUNTER — Other Ambulatory Visit: Payer: Self-pay | Admitting: *Deleted

## 2022-04-01 ENCOUNTER — Ambulatory Visit: Payer: Medicaid Other | Attending: Family Medicine

## 2022-04-01 DIAGNOSIS — Z3A21 21 weeks gestation of pregnancy: Secondary | ICD-10-CM | POA: Insufficient documentation

## 2022-04-01 DIAGNOSIS — Z3480 Encounter for supervision of other normal pregnancy, unspecified trimester: Secondary | ICD-10-CM

## 2022-04-01 DIAGNOSIS — Z3689 Encounter for other specified antenatal screening: Secondary | ICD-10-CM | POA: Diagnosis not present

## 2022-04-01 DIAGNOSIS — Z363 Encounter for antenatal screening for malformations: Secondary | ICD-10-CM | POA: Diagnosis not present

## 2022-04-01 DIAGNOSIS — Z362 Encounter for other antenatal screening follow-up: Secondary | ICD-10-CM

## 2022-04-01 NOTE — Progress Notes (Deleted)
Detailed  anatomy

## 2022-04-05 ENCOUNTER — Ambulatory Visit (INDEPENDENT_AMBULATORY_CARE_PROVIDER_SITE_OTHER): Payer: Medicaid Other | Admitting: Obstetrics & Gynecology

## 2022-04-05 VITALS — BP 104/63 | HR 81 | Wt 146.1 lb

## 2022-04-05 DIAGNOSIS — Z789 Other specified health status: Secondary | ICD-10-CM | POA: Insufficient documentation

## 2022-04-05 DIAGNOSIS — Z3A22 22 weeks gestation of pregnancy: Secondary | ICD-10-CM

## 2022-04-05 DIAGNOSIS — Z603 Acculturation difficulty: Secondary | ICD-10-CM

## 2022-04-05 DIAGNOSIS — Z3482 Encounter for supervision of other normal pregnancy, second trimester: Secondary | ICD-10-CM

## 2022-04-05 DIAGNOSIS — Z3480 Encounter for supervision of other normal pregnancy, unspecified trimester: Secondary | ICD-10-CM

## 2022-04-05 NOTE — Progress Notes (Signed)
   PRENATAL VISIT NOTE  Subjective:  Kara Monroe is a 32 y.o. [id number] at 39w2dbeing seen today for ongoing prenatal care.  She is currently monitored for the following issues for this low-risk pregnancy and has Supervision of other normal pregnancy, antepartum and Language barrier affecting health care on their problem list.  Patient reports no complaints.  Contractions: Not present. Vag. Bleeding: None.  Movement: Present. Denies leaking of fluid.   The following portions of the patient's history were reviewed and updated as appropriate: allergies, current medications, past family history, past medical history, past social history, past surgical history and problem list.   Objective:   Vitals:   04/05/22 1117  BP: 104/63  Pulse: 81  Weight: 66.3 kg    Fetal Status: Fetal Heart Rate (bpm): 153   Movement: Present     General:  Alert, oriented and cooperative. Patient is in no acute distress.  Skin: Skin is warm and dry. No rash noted.   Cardiovascular: Normal heart rate noted  Respiratory: Normal respiratory effort, no problems with respiration noted  Abdomen: Soft, gravid, appropriate for gestational age.  Pain/Pressure: Absent     Pelvic: Cervical exam deferred        Extremities: Normal range of motion.  Edema: None  Mental Status: Normal mood and affect. Normal behavior. Normal judgment and thought content.   Assessment and Plan:  Pregnancy: G5P3013 at 212w2d. Supervision of other normal pregnancy, antepartum F/u anatomy scan in 4 weeks  2. Language barrier affecting health care KiFultonnterpreter present  Preterm labor symptoms and general obstetric precautions including but not limited to vaginal bleeding, contractions, leaking of fluid and fetal movement were reviewed in detail with the patient. Please refer to After Visit Summary for other counseling recommendations.   Return in about 4 weeks (around 05/03/2022).  Future Appointments  Date Time  Provider [place]/01/2023 10:30 AM [facility]/01/2023 10:45 AM [facility] US4 [facility]3/15/2024  8:55 AM DuRadene GunningMD [facility]  JaEmeterio ReeveMD

## 2022-04-30 ENCOUNTER — Ambulatory Visit: Payer: Medicaid Other | Attending: Obstetrics and Gynecology

## 2022-04-30 ENCOUNTER — Ambulatory Visit: Payer: Medicaid Other

## 2022-04-30 DIAGNOSIS — Z3A25 25 weeks gestation of pregnancy: Secondary | ICD-10-CM | POA: Diagnosis not present

## 2022-04-30 DIAGNOSIS — Z362 Encounter for other antenatal screening follow-up: Secondary | ICD-10-CM

## 2022-05-01 NOTE — Progress Notes (Deleted)
   PRENATAL VISIT NOTE  Subjective:  Kara Monroe is a 32 y.o. [id number] at [redacted]w[redacted]d being seen today for ongoing prenatal care.  She is currently monitored for the following issues for this low-risk pregnancy and has Supervision of other normal pregnancy, antepartum and Language barrier affecting health care on their problem list.  Patient reports {[id number]}.   .  .   . Denies leaking of fluid.   The following portions of the patient's history were reviewed and updated as appropriate: allergies, current medications, past family history, past medical history, past social history, past surgical history and problem list.   Objective:  There were no vitals filed for this visit.  Fetal Status:           General:  Alert, oriented and cooperative. Patient is in no acute distress.  Skin: Skin is warm and dry. No rash noted.   Cardiovascular: Normal heart rate noted  Respiratory: Normal respiratory effort, no problems with respiration noted  Abdomen: Soft, gravid, appropriate for gestational age.        Pelvic: Cervical exam deferred        Extremities: Normal range of motion.     Mental Status: Normal mood and affect. Normal behavior. Normal judgment and thought content.   Assessment and Plan:  Pregnancy: L2G4010 at [redacted]w[redacted]d 1. Supervision of other normal pregnancy, antepartum 28w labs today Tdap nv Rh pos  2. Language barrier affecting health care ***  Preterm labor symptoms and general obstetric precautions including but not limited to vaginal bleeding, contractions, leaking of fluid and fetal movement were reviewed in detail with the patient. Please refer to After Visit Summary for other counseling recommendations.   No follow-ups on file.  Future Appointments  Date Time Provider [place]  05/03/2022  8:20 AM [facility]  05/03/2022  8:55 AM Radene Gunning, MD [facility]    Radene Gunning, MD

## 2022-05-03 ENCOUNTER — Other Ambulatory Visit: Payer: Medicaid Other

## 2022-05-03 ENCOUNTER — Encounter: Payer: Self-pay | Admitting: Medical

## 2022-05-03 DIAGNOSIS — Z3480 Encounter for supervision of other normal pregnancy, unspecified trimester: Secondary | ICD-10-CM

## 2022-05-03 DIAGNOSIS — Z789 Other specified health status: Secondary | ICD-10-CM

## 2022-05-24 ENCOUNTER — Other Ambulatory Visit: Payer: Self-pay

## 2022-05-24 ENCOUNTER — Other Ambulatory Visit: Payer: Medicaid Other

## 2022-05-24 ENCOUNTER — Ambulatory Visit (INDEPENDENT_AMBULATORY_CARE_PROVIDER_SITE_OTHER): Payer: Medicaid Other | Admitting: Certified Nurse Midwife

## 2022-05-24 VITALS — BP 85/53 | HR 98 | Wt 149.9 lb

## 2022-05-24 DIAGNOSIS — Z3403 Encounter for supervision of normal first pregnancy, third trimester: Secondary | ICD-10-CM

## 2022-05-24 DIAGNOSIS — Z3480 Encounter for supervision of other normal pregnancy, unspecified trimester: Secondary | ICD-10-CM

## 2022-05-24 DIAGNOSIS — Z23 Encounter for immunization: Secondary | ICD-10-CM | POA: Diagnosis not present

## 2022-05-24 DIAGNOSIS — Z603 Acculturation difficulty: Secondary | ICD-10-CM

## 2022-05-24 DIAGNOSIS — Z3A29 29 weeks gestation of pregnancy: Secondary | ICD-10-CM

## 2022-05-24 DIAGNOSIS — Z758 Other problems related to medical facilities and other health care: Secondary | ICD-10-CM

## 2022-05-24 NOTE — Progress Notes (Unsigned)
   PRENATAL VISIT NOTE  Subjective:  Kara Monroe is a 32 y.o. [id number] at [redacted]w[redacted]d being seen today for ongoing prenatal care.  She is currently monitored for the following issues for this low-risk pregnancy and has Supervision of other normal pregnancy, antepartum and Language barrier affecting health care on their problem list.  Patient reports no complaints.  Contractions: Not present. Vag. Bleeding: None.  Movement: Present. Denies leaking of fluid.   The following portions of the patient's history were reviewed and updated as appropriate: allergies, current medications, past family history, past medical history, past social history, past surgical history and problem list.   Objective:   Vitals:   05/24/22 0859  BP: (!) 85/53  Pulse: 98  Weight: 149 lb 14.4 oz (68 kg)    Fetal Status: Fetal Heart Rate (bpm): 140   Movement: Present     General:  Alert, oriented and cooperative. Patient is in no acute distress.  Skin: Skin is warm and dry. No rash noted.   Cardiovascular: Normal heart rate noted  Respiratory: Normal respiratory effort, no problems with respiration noted  Abdomen: Soft, gravid, appropriate for gestational age.  Pain/Pressure: Absent     Pelvic: Cervical exam deferred        Extremities: Normal range of motion.  Edema: None  Mental Status: Normal mood and affect. Normal behavior. Normal judgment and thought content.   Assessment and Plan:  Pregnancy: G5P3013 at [redacted]w[redacted]d 1. Encounter for supervision of low-risk first pregnancy in third trimester - Patient feeling frequent and vigorous fetal movement.  - Tdap vaccine greater than or equal to 7yo IM  2. [redacted] weeks gestation of pregnancy - GTT collected today.   3. Language barrier affecting health care - Due to language barrier, an interpreter was present during the history-taking and subsequent discussion (and for part of the physical exam) with this patient. - [employer] video interpreter used for the duration of  this visit.    Preterm labor symptoms and general obstetric precautions including but not limited to vaginal bleeding, contractions, leaking of fluid and fetal movement were reviewed in detail with the patient. Please refer to After Visit Summary for other counseling recommendations.   Return in about 2 weeks (around 06/07/2022) for LOB.  No future appointments.   Pavle Wiler Danella Deis) Suzie Portela, MSN, CNM  Center for [facility]  05/24/22 9:22 AM

## 2022-05-25 LAB — CBC
Hematocrit: 33.8 % — ABNORMAL LOW (ref 34.0–46.6)
Hemoglobin: 10.7 g/dL — ABNORMAL LOW (ref 11.1–15.9)
MCH: 26.4 pg — ABNORMAL LOW (ref 26.6–33.0)
MCHC: 31.7 g/dL (ref 31.5–35.7)
MCV: 84 fL (ref 79–97)
Platelets: 230 10*3/uL (ref 150–450)
RBC: 4.05 x10E6/uL (ref 3.77–5.28)
RDW: 13.8 % (ref 11.7–15.4)
WBC: 5.5 10*3/uL (ref 3.4–10.8)

## 2022-05-25 LAB — GLUCOSE TOLERANCE, 2 HOURS W/ 1HR
Glucose, 1 hour: 87 mg/dL (ref 70–179)
Glucose, 2 hour: 76 mg/dL (ref 70–152)
Glucose, Fasting: 73 mg/dL (ref 70–91)

## 2022-05-25 LAB — HIV ANTIBODY (ROUTINE TESTING W REFLEX): HIV Screen 4th Generation wRfx: NONREACTIVE

## 2022-05-25 LAB — RPR: RPR Ser Ql: NONREACTIVE

## 2022-06-10 ENCOUNTER — Other Ambulatory Visit (HOSPITAL_COMMUNITY)
Admission: RE | Admit: 2022-06-10 | Discharge: 2022-06-10 | Disposition: A | Discharge status: Home / Self Care | Payer: Medicaid Other | Source: Ambulatory Visit | Attending: Student | Admitting: Student

## 2022-06-10 ENCOUNTER — Other Ambulatory Visit: Payer: Self-pay

## 2022-06-10 ENCOUNTER — Ambulatory Visit (INDEPENDENT_AMBULATORY_CARE_PROVIDER_SITE_OTHER): Payer: Medicaid Other | Admitting: Student

## 2022-06-10 VITALS — BP 104/67 | HR 103 | Wt 146.0 lb

## 2022-06-10 DIAGNOSIS — Z3480 Encounter for supervision of other normal pregnancy, unspecified trimester: Secondary | ICD-10-CM

## 2022-06-10 DIAGNOSIS — Z758 Other problems related to medical facilities and other health care: Secondary | ICD-10-CM

## 2022-06-10 DIAGNOSIS — N898 Other specified noninflammatory disorders of vagina: Secondary | ICD-10-CM

## 2022-06-10 DIAGNOSIS — O99013 Anemia complicating pregnancy, third trimester: Secondary | ICD-10-CM

## 2022-06-10 DIAGNOSIS — Z3A31 31 weeks gestation of pregnancy: Secondary | ICD-10-CM

## 2022-06-10 DIAGNOSIS — Z603 Acculturation difficulty: Secondary | ICD-10-CM

## 2022-06-10 MED ORDER — PRENATAL PLUS 27-1 MG PO TABS
1.0000 | ORAL_TABLET | Freq: Every day | ORAL | 11 refills | Status: DC
Start: 2022-06-10 — End: 2022-08-16

## 2022-06-10 MED ORDER — FERROUS SULFATE 325 (65 FE) MG PO TBEC
325.0000 mg | DELAYED_RELEASE_TABLET | ORAL | 1 refills | Status: DC
Start: 2022-06-10 — End: 2022-06-10

## 2022-06-10 MED ORDER — FERROUS SULFATE 325 (65 FE) MG PO TBEC
325.0000 mg | DELAYED_RELEASE_TABLET | ORAL | 1 refills | Status: DC
Start: 2022-06-10 — End: 2022-10-08

## 2022-06-10 NOTE — Progress Notes (Signed)
   PRENATAL VISIT NOTE  Subjective:  Kara Monroe is a 32 y.o. [id number] at [redacted]w[redacted]d being seen today for ongoing prenatal care.  She is currently monitored for the following issues for this low-risk pregnancy and has Supervision of other normal pregnancy, antepartum and Language barrier affecting health care on their problem list.  Patient reports vaginal irritation. States for the last two weeks the entrance to patient's vagina has been itchy. Denies any change in discharge or urinary patterns.  Contractions: Not present. Vag. Bleeding: None.  Movement: Present. Denies leaking of fluid.   The following portions of the patient's history were reviewed and updated as appropriate: allergies, current medications, past family history, past medical history, past social history, past surgical history and problem list.   Objective:   Vitals:   06/10/22 1002  BP: 104/67  Pulse: (!) 103  Weight: 146 lb (66.2 kg)    Fetal Status: Fetal Heart Rate (bpm): 138   Movement: Present     General:  Alert, oriented and cooperative. Patient is in no acute distress.  Skin: Skin is warm and dry. No rash noted.   Cardiovascular: Normal heart rate noted  Respiratory: Normal respiratory effort, no problems with respiration noted  Abdomen: Soft, gravid, appropriate for gestational age.  Pain/Pressure: Absent     Pelvic: Cervical exam deferred        Extremities: Normal range of motion.  Edema: None  Mental Status: Normal mood and affect. Normal behavior. Normal judgment and thought content.   Assessment and Plan:  Pregnancy: J4N8295 at [redacted]w[redacted]d 1. Supervision of other normal pregnancy, antepartum - Doing well, frequent fetal movement  2. [redacted] weeks gestation of pregnancy - continue routine follow-up  3. Language barrier affecting health care - Interpreter at the bedside  4. Anemia affecting pregnancy in third trimester - discussed importance of iron tablet and PNV  5. Vaginal irritation - Patient  elected to complete self-swab - Cervicovaginal ancillary only( [place])   Preterm labor symptoms and general obstetric precautions including but not limited to vaginal bleeding, contractions, leaking of fluid and fetal movement were reviewed in detail with the patient. Please refer to After Visit Summary for other counseling recommendations.   Return in about 2 weeks (around 06/24/2022) for LOB, IN-PERSON.  Future Appointments  Date Time Provider Department Center  06/24/2022 10:15 AM Reva Bores, MD [facility]    Corlis Hove, NP

## 2022-06-11 LAB — CERVICOVAGINAL ANCILLARY ONLY
Bacterial Vaginitis (gardnerella): NEGATIVE
Candida Glabrata: NEGATIVE
Candida Vaginitis: POSITIVE — AB
Chlamydia: NEGATIVE
Comment: NEGATIVE
Comment: NEGATIVE
Comment: NEGATIVE
Comment: NEGATIVE
Comment: NEGATIVE
Comment: NORMAL
Neisseria Gonorrhea: NEGATIVE
Trichomonas: NEGATIVE

## 2022-06-12 ENCOUNTER — Other Ambulatory Visit: Payer: Self-pay | Admitting: Student

## 2022-06-12 DIAGNOSIS — B3731 Acute candidiasis of vulva and vagina: Secondary | ICD-10-CM

## 2022-06-12 DIAGNOSIS — N898 Other specified noninflammatory disorders of vagina: Secondary | ICD-10-CM

## 2022-06-12 MED ORDER — MICONAZOLE NITRATE 2 % VA CREA
1.0000 | TOPICAL_CREAM | Freq: Every day | VAGINAL | 2 refills | Status: DC
Start: 2022-06-12 — End: 2022-06-24

## 2022-06-13 ENCOUNTER — Telehealth: Payer: Self-pay

## 2022-06-13 NOTE — Telephone Encounter (Signed)
-----   Message from Corlis Hove, NP sent at 06/12/2022  9:17 PM EDT ----- Hello - patient does not have MyChart and needs interpreter. I placed on order for vaginal cream or she can try something over the counter since her insurance does not cover the option I have to prescribe. Can you please notify patient? Thank you so much.

## 2022-06-13 NOTE — Telephone Encounter (Signed)
Call placed to pt using [employer] interpreter # [phone]. Call placed to pt. Pt picked up but unable to hear on the other line x 2.  Will try again to contact.  Judeth Cornfield, RNC

## 2022-06-18 NOTE — Telephone Encounter (Signed)
Unable to contact pt.  Letter sent.   Kara Monroe  06/18/22

## 2022-06-18 NOTE — Telephone Encounter (Signed)
Unable to reach.  Letter sent.   Kara Monroe  06/18/22

## 2022-06-24 ENCOUNTER — Ambulatory Visit (INDEPENDENT_AMBULATORY_CARE_PROVIDER_SITE_OTHER): Payer: Medicaid Other | Admitting: Family Medicine

## 2022-06-24 ENCOUNTER — Other Ambulatory Visit: Payer: Self-pay

## 2022-06-24 VITALS — BP 90/62 | HR 107 | Wt 148.0 lb

## 2022-06-24 DIAGNOSIS — Z3480 Encounter for supervision of other normal pregnancy, unspecified trimester: Secondary | ICD-10-CM

## 2022-06-24 DIAGNOSIS — Z758 Other problems related to medical facilities and other health care: Secondary | ICD-10-CM

## 2022-06-24 DIAGNOSIS — Z603 Acculturation difficulty: Secondary | ICD-10-CM

## 2022-06-24 DIAGNOSIS — N898 Other specified noninflammatory disorders of vagina: Secondary | ICD-10-CM

## 2022-06-24 DIAGNOSIS — B3731 Acute candidiasis of vulva and vagina: Secondary | ICD-10-CM

## 2022-06-24 MED ORDER — MICONAZOLE NITRATE 2 % VA CREA
1.0000 | TOPICAL_CREAM | Freq: Every day | VAGINAL | 2 refills | Status: DC
Start: 2022-06-24 — End: 2022-08-01

## 2022-06-24 NOTE — Progress Notes (Signed)
   PRENATAL VISIT NOTE  Subjective:  Kara Monroe is a 32 y.o. V4Q5956 at [redacted]w[redacted]d being seen today for ongoing prenatal care.  She is currently monitored for the following issues for this low-risk pregnancy and has Supervision of other normal pregnancy, antepartum and Language barrier affecting health care on their problem list.  Patient reports vaginal irritation.  Contractions: Not present. Vag. Bleeding: None.  Movement: Present. Denies leaking of fluid.   The following portions of the patient's history were reviewed and updated as appropriate: allergies, current medications, past family history, past medical history, past social history, past surgical history and problem list.   Objective:   Vitals:   06/24/22 1006  BP: 90/62  Pulse: (!) 107  Weight: 148 lb (67.1 kg)    Fetal Status:   Fundal Height: 31 cm Movement: Present     General:  Alert, oriented and cooperative. Patient is in no acute distress.  Skin: Skin is warm and dry. No rash noted.   Cardiovascular: Normal heart rate noted  Respiratory: Normal respiratory effort, no problems with respiration noted  Abdomen: Soft, gravid, appropriate for gestational age.  Pain/Pressure: Absent     Pelvic: Cervical exam deferred        Extremities: Normal range of motion.  Edema: None  Mental Status: Normal mood and affect. Normal behavior. Normal judgment and thought content.   Assessment and Plan:  Pregnancy: L8V5643 at [redacted]w[redacted]d 1. Supervision of other normal pregnancy, antepartum Continue routine prenatal care.  2. Language barrier affecting health care Kinyarwanda interpreter: video used   3. Candida vaginitis Rx given - miconazole (MONISTAT 7) 2 % vaginal cream; Place 1 Applicatorful vaginally at bedtime. Apply for seven nights  Dispense: 30 g; Refill: 2   Preterm labor symptoms and general obstetric precautions including but not limited to vaginal bleeding, contractions, leaking of fluid and fetal movement were  reviewed in detail with the patient. Please refer to After Visit Summary for other counseling recommendations.   Return in 2 weeks (on 07/08/2022) for [facility].  No future appointments.  Reva Bores, MD

## 2022-06-24 NOTE — Patient Instructions (Addendum)
[email or web address]  

## 2022-07-11 ENCOUNTER — Other Ambulatory Visit: Payer: Self-pay

## 2022-07-11 ENCOUNTER — Ambulatory Visit (INDEPENDENT_AMBULATORY_CARE_PROVIDER_SITE_OTHER): Payer: Medicaid Other | Admitting: Student

## 2022-07-11 ENCOUNTER — Encounter: Payer: Medicaid Other | Admitting: Student

## 2022-07-11 ENCOUNTER — Other Ambulatory Visit (HOSPITAL_COMMUNITY)
Admission: RE | Admit: 2022-07-11 | Discharge: 2022-07-11 | Disposition: A | Discharge status: Home / Self Care | Payer: Medicaid Other | Source: Ambulatory Visit | Attending: Student | Admitting: Student

## 2022-07-11 VITALS — BP 93/82 | HR 134 | Wt 149.1 lb

## 2022-07-11 DIAGNOSIS — Z3480 Encounter for supervision of other normal pregnancy, unspecified trimester: Secondary | ICD-10-CM | POA: Insufficient documentation

## 2022-07-11 DIAGNOSIS — Z3483 Encounter for supervision of other normal pregnancy, third trimester: Secondary | ICD-10-CM

## 2022-07-11 DIAGNOSIS — Z3A36 36 weeks gestation of pregnancy: Secondary | ICD-10-CM | POA: Diagnosis not present

## 2022-07-11 DIAGNOSIS — N898 Other specified noninflammatory disorders of vagina: Secondary | ICD-10-CM

## 2022-07-11 DIAGNOSIS — B3731 Acute candidiasis of vulva and vagina: Secondary | ICD-10-CM

## 2022-07-11 NOTE — Progress Notes (Signed)
   PRENATAL VISIT NOTE  Subjective:  Kara Monroe is a 32 y.o. [id number] at [redacted]w[redacted]d being seen today for ongoing prenatal care.  She is currently monitored for the following issues for this low-risk pregnancy and has Supervision of other normal pregnancy, antepartum and Language barrier affecting health care on their problem list.  Patient reports  intermittent pelvic pain .  Contractions: Not present. Vag. Bleeding: None.  Movement: Present. Denies leaking of fluid.   The following portions of the patient's history were reviewed and updated as appropriate: allergies, current medications, past family history, past medical history, past social history, past surgical history and problem list.   Objective:   Vitals:   07/11/22 1026  BP: 93/82  Pulse: (!) 134  Weight: 149 lb 1.6 oz (67.6 kg)    Fetal Status: Fetal Heart Rate (bpm): 166   Movement: Present  Presentation: Vertex  General:  Alert, oriented and cooperative. Patient is in no acute distress.  Skin: Skin is warm and dry. No rash noted.   Cardiovascular: Normal heart rate noted  Respiratory: Normal respiratory effort, no problems with respiration noted  Abdomen: Soft, gravid, appropriate for gestational age.  Pain/Pressure: Present     Pelvic: Cervical exam performed in the presence of a chaperone Dilation: Closed Effacement (%): 10, 20 Station: -3  Extremities: Normal range of motion.  Edema: None  Mental Status: Normal mood and affect. Normal behavior. Normal judgment and thought content.   Assessment and Plan:  Pregnancy: A5W0981 at [redacted]w[redacted]d 1. Supervision of other normal pregnancy, antepartum - Doing well, vigorous and frequent fetal movement - Culture, beta strep (group b only) - GC/Chlamydia Probe Amp  2. [redacted] weeks gestation of pregnancy - Continue routine follow-up - Culture, beta strep (group b only) - GC/Chlamydia Probe Amp   3. Vaginal irritation 4. Candida vaginitis - Patient has not started treatment,  discussed appropriate application   Preterm labor symptoms and general obstetric precautions including but not limited to vaginal bleeding, contractions, leaking of fluid and fetal movement were reviewed in detail with the patient. Please refer to After Visit Summary for other counseling recommendations.   Return in about 1 week (around 07/18/2022) for LOB, IN-PERSON.  Future Appointments  Date Time Provider Department Center  07/25/2022  8:55 AM Sue Lush, FNP [facility]    Corlis Hove, NP

## 2022-07-12 LAB — GC/CHLAMYDIA PROBE AMP (~~LOC~~) NOT AT ARMC
Chlamydia: NEGATIVE
Comment: NEGATIVE
Comment: NORMAL
Neisseria Gonorrhea: NEGATIVE

## 2022-07-15 LAB — CULTURE, BETA STREP (GROUP B ONLY): Strep Gp B Culture: NEGATIVE

## 2022-07-25 ENCOUNTER — Other Ambulatory Visit: Payer: Self-pay

## 2022-07-25 ENCOUNTER — Ambulatory Visit (INDEPENDENT_AMBULATORY_CARE_PROVIDER_SITE_OTHER): Payer: Medicaid Other | Admitting: Obstetrics and Gynecology

## 2022-07-25 VITALS — BP 108/72 | HR 95 | Wt 148.0 lb

## 2022-07-25 DIAGNOSIS — Z3A38 38 weeks gestation of pregnancy: Secondary | ICD-10-CM

## 2022-07-25 DIAGNOSIS — Z758 Other problems related to medical facilities and other health care: Secondary | ICD-10-CM

## 2022-07-25 DIAGNOSIS — Z3480 Encounter for supervision of other normal pregnancy, unspecified trimester: Secondary | ICD-10-CM

## 2022-07-25 DIAGNOSIS — Z603 Acculturation difficulty: Secondary | ICD-10-CM

## 2022-07-25 DIAGNOSIS — O99013 Anemia complicating pregnancy, third trimester: Secondary | ICD-10-CM

## 2022-07-25 DIAGNOSIS — Z3403 Encounter for supervision of normal first pregnancy, third trimester: Secondary | ICD-10-CM

## 2022-07-25 NOTE — Progress Notes (Signed)
   PRENATAL VISIT NOTE  Subjective:  Kara Monroe is a 32 y.o. [id number] at [redacted]w[redacted]d being seen today for ongoing prenatal care.  Kara Monroe is currently monitored for the following issues for this low-risk pregnancy and has Supervision of other normal pregnancy, antepartum and Language barrier affecting health care on their problem list.  Patient reports no complaints.  Contractions: Not present. Vag. Bleeding: None.  Movement: Present. Denies leaking of fluid.   The following portions of the patient's history were reviewed and updated as appropriate: allergies, current medications, past family history, past medical history, past social history, past surgical history and problem list.   Objective:   Vitals:   07/25/22 0900  BP: 108/72  Pulse: 95  Weight: 148 lb (67.1 kg)    Fetal Status: Fetal Heart Rate (bpm): 144 Fundal Height: 37 cm Movement: Present     General:  Alert, oriented and cooperative. Patient is in no acute distress.  Skin: Skin is warm and dry. No rash noted.   Cardiovascular: Normal heart rate noted  Respiratory: Normal respiratory effort, no problems with respiration noted  Abdomen: Soft, gravid, appropriate for gestational age.  Pain/Pressure: Absent     Pelvic: Cervical exam deferred        Extremities: Normal range of motion.  Edema: None  Mental Status: Normal mood and affect. Normal behavior. Normal judgment and thought content.   Assessment and Plan:  Pregnancy: A5W0981 at [redacted]w[redacted]d 1. Encounter for supervision of low-risk first pregnancy in third trimester BP and FHR normal FH appropriate Feeling vigorous movement Notified of normal labs and glucose test  2. Anemia affecting pregnancy in third trimester 4/5 hgb 10.7 on PO iron  3. [redacted] weeks gestation of pregnancy Routine OB care Feeling well overall, no concerns   4. Language barrier In-person interpreter present for entire visit   Term labor symptoms and general obstetric precautions including but not  limited to vaginal bleeding, contractions, leaking of fluid and fetal movement were reviewed in detail with the patient. Please refer to After Visit Summary for other counseling recommendations.   Return in about 1 week (around 08/01/2022) for OB VISIT (MD or APP).  Future Appointments  Date Time Provider Department Center  08/01/2022  1:35 PM Mora Appl [facility]  08/09/2022  9:35 AM Anyanwu, Jethro Bastos, MD [facility]    Albertine Grates, FNP

## 2022-08-01 ENCOUNTER — Ambulatory Visit (INDEPENDENT_AMBULATORY_CARE_PROVIDER_SITE_OTHER): Payer: Medicaid Other | Admitting: Advanced Practice Midwife

## 2022-08-01 ENCOUNTER — Other Ambulatory Visit: Payer: Self-pay

## 2022-08-01 VITALS — BP 96/67 | HR 92 | Wt 150.0 lb

## 2022-08-01 DIAGNOSIS — Z3483 Encounter for supervision of other normal pregnancy, third trimester: Secondary | ICD-10-CM

## 2022-08-01 DIAGNOSIS — Z603 Acculturation difficulty: Secondary | ICD-10-CM

## 2022-08-01 DIAGNOSIS — Z3480 Encounter for supervision of other normal pregnancy, unspecified trimester: Secondary | ICD-10-CM

## 2022-08-01 DIAGNOSIS — Z3A39 39 weeks gestation of pregnancy: Secondary | ICD-10-CM

## 2022-08-01 DIAGNOSIS — Z758 Other problems related to medical facilities and other health care: Secondary | ICD-10-CM

## 2022-08-01 NOTE — Progress Notes (Signed)
   PRENATAL VISIT NOTE  Subjective:  Kara Monroe is a 32 y.o. [id number] at [redacted]w[redacted]d being seen today for ongoing prenatal care.  She is currently monitored for the following issues for this low-risk pregnancy and has Supervision of other normal pregnancy, antepartum and Language barrier affecting health care on their problem list.  Patient reports no bleeding, no leaking, and occasional contractions.  Contractions: Irritability. Vag. Bleeding: None.  Movement: Present. Denies leaking of fluid.   The following portions of the patient's history were reviewed and updated as appropriate: allergies, current medications, past family history, past medical history, past social history, past surgical history and problem list.   Objective:   Vitals:   08/01/22 1345  BP: 96/67  Pulse: 92  Weight: 150 lb (68 kg)    Fetal Status: Fetal Heart Rate (bpm): 145   Movement: Present     General:  Alert, oriented and cooperative. Patient is in no acute distress.  Skin: Skin is warm and dry. No rash noted.   Cardiovascular: Normal heart rate noted  Respiratory: Normal respiratory effort, no problems with respiration noted  Abdomen: Soft, gravid, appropriate for gestational age.  Pain/Pressure: Present     Pelvic: Cervical exam deferred        Extremities: Normal range of motion.  Edema: None  Mental Status: Normal mood and affect. Normal behavior. Normal judgment and thought content.   Assessment and Plan:  Pregnancy: A2Z3086 at [redacted]w[redacted]d 1. Supervision of other normal pregnancy, antepartum - Routine care - Postdates testing at 40+ weeks - Plan IOL at 41 weeks if not delivered   2. Language barrier affecting health care - Kinyarwanda interpreter used  3. [redacted] weeks gestation of pregnancy   Term labor symptoms and general obstetric precautions including but not limited to vaginal bleeding, contractions, leaking of fluid and fetal movement were reviewed in detail with the patient. Please refer to  After Visit Summary for other counseling recommendations.   Return in about 1 week (around 08/08/2022) for ROB.  Future Appointments  Date Time Provider Department Center  08/09/2022  9:35 AM Anyanwu, Jethro Bastos, MD [facility]    Dorathy Kinsman, [place]

## 2022-08-09 ENCOUNTER — Ambulatory Visit (INDEPENDENT_AMBULATORY_CARE_PROVIDER_SITE_OTHER): Payer: Medicaid Other | Admitting: Obstetrics & Gynecology

## 2022-08-09 VITALS — BP 93/63 | HR 109 | Wt 149.6 lb

## 2022-08-09 DIAGNOSIS — Z3A40 40 weeks gestation of pregnancy: Secondary | ICD-10-CM

## 2022-08-09 DIAGNOSIS — Z3483 Encounter for supervision of other normal pregnancy, third trimester: Secondary | ICD-10-CM

## 2022-08-09 DIAGNOSIS — Z3480 Encounter for supervision of other normal pregnancy, unspecified trimester: Secondary | ICD-10-CM

## 2022-08-09 NOTE — Progress Notes (Signed)
   PRENATAL VISIT NOTE  Subjective:  Kara Monroe is a 32 y.o. [id number] at [redacted]w[redacted]d being seen today for ongoing prenatal care.  Kinyarwanda interpreter present.  She is currently monitored for the following issues for this low-risk pregnancy and has Supervision of other normal pregnancy, antepartum and Language barrier affecting health care on their problem list.  Patient reports no complaints.  Contractions: Regular (intermittent).  .  Movement: Present. Denies leaking of fluid.   The following portions of the patient's history were reviewed and updated as appropriate: allergies, current medications, past family history, past medical history, past social history, past surgical history and problem list.   Objective:   Vitals:   08/09/22 0951  BP: 93/63  Pulse: (!) 109  Weight: 149 lb 9.6 oz (67.9 kg)    Fetal Status: Fetal Heart Rate (bpm): 140 Fundal Height: 40 cm Movement: Present  Presentation: Vertex  General:  Alert, oriented and cooperative. Patient is in no acute distress.  Skin: Skin is warm and dry. No rash noted.   Cardiovascular: Normal heart rate noted  Respiratory: Normal respiratory effort, no problems with respiration noted  Abdomen: Soft, gravid, appropriate for gestational age.  Pain/Pressure: Absent     Pelvic: Cervical exam deferred        Extremities: Normal range of motion.  Edema: None  Mental Status: Normal mood and affect. Normal behavior. Normal judgment and thought content.   Assessment and Plan:  Pregnancy: G5P3013 at [redacted]w[redacted]d 1. [redacted] weeks gestation of pregnancy 2. Supervision of other normal pregnancy, antepartum Postdates testing early next week. IOL scheduled at 41 weeks, 08/15/22 at midnight. Term labor symptoms and general obstetric precautions including but not limited to vaginal bleeding, contractions, leaking of fluid and fetal movement were reviewed in detail with the patient. Please refer to After Visit Summary for other counseling  recommendations.   Return in about 3 days (around 08/12/2022) for Postdates BPP or NST/AFI early next week (no MD appointment needed), IOL booked 6/27 at midnight.  Future Appointments  Date Time Provider Department Center  08/12/2022 10:15 AM [facility] US2 [facility]  08/15/2022 12:00 AM [facility] SCHED ROOM [facility] None    Jaynie Collins, MD

## 2022-08-10 ENCOUNTER — Other Ambulatory Visit: Payer: Self-pay | Admitting: Advanced Practice Midwife

## 2022-08-12 ENCOUNTER — Other Ambulatory Visit: Payer: Self-pay | Admitting: *Deleted

## 2022-08-12 ENCOUNTER — Telehealth (HOSPITAL_COMMUNITY): Payer: Self-pay | Admitting: *Deleted

## 2022-08-12 ENCOUNTER — Encounter (HOSPITAL_COMMUNITY): Payer: Self-pay | Admitting: *Deleted

## 2022-08-12 ENCOUNTER — Ambulatory Visit (INDEPENDENT_AMBULATORY_CARE_PROVIDER_SITE_OTHER): Payer: Medicaid Other

## 2022-08-12 DIAGNOSIS — Z3A40 40 weeks gestation of pregnancy: Secondary | ICD-10-CM | POA: Diagnosis not present

## 2022-08-12 DIAGNOSIS — O480 Post-term pregnancy: Secondary | ICD-10-CM

## 2022-08-12 NOTE — Telephone Encounter (Signed)
Preadmission screen interpreter number [id number]

## 2022-08-14 ENCOUNTER — Other Ambulatory Visit: Payer: Medicaid Other

## 2022-08-15 ENCOUNTER — Inpatient Hospital Stay (HOSPITAL_COMMUNITY): Payer: Medicaid Other

## 2022-08-15 ENCOUNTER — Inpatient Hospital Stay (HOSPITAL_COMMUNITY)
Admission: AD | Admit: 2022-08-15 | Discharge: 2022-08-16 | DRG: 807 | Disposition: A | Discharge status: Home / Self Care | Payer: Medicaid Other | Attending: Family Medicine | Admitting: Family Medicine

## 2022-08-15 ENCOUNTER — Encounter (HOSPITAL_COMMUNITY): Payer: Self-pay | Admitting: Obstetrics & Gynecology

## 2022-08-15 DIAGNOSIS — Z603 Acculturation difficulty: Secondary | ICD-10-CM | POA: Diagnosis present

## 2022-08-15 DIAGNOSIS — Z3A41 41 weeks gestation of pregnancy: Secondary | ICD-10-CM

## 2022-08-15 DIAGNOSIS — O480 Post-term pregnancy: Principal | ICD-10-CM | POA: Diagnosis present

## 2022-08-15 DIAGNOSIS — Z3480 Encounter for supervision of other normal pregnancy, unspecified trimester: Secondary | ICD-10-CM

## 2022-08-15 DIAGNOSIS — Z3A40 40 weeks gestation of pregnancy: Secondary | ICD-10-CM

## 2022-08-15 DIAGNOSIS — Z758 Other problems related to medical facilities and other health care: Secondary | ICD-10-CM | POA: Diagnosis present

## 2022-08-15 LAB — TYPE AND SCREEN
ABO/RH(D): O POS
Antibody Screen: NEGATIVE

## 2022-08-15 LAB — CBC
HCT: 32 % — ABNORMAL LOW (ref 36.0–46.0)
Hemoglobin: 10 g/dL — ABNORMAL LOW (ref 12.0–15.0)
MCH: 23.8 pg — ABNORMAL LOW (ref 26.0–34.0)
MCHC: 31.3 g/dL (ref 30.0–36.0)
MCV: 76.2 fL — ABNORMAL LOW (ref 80.0–100.0)
Platelets: 217 10*3/uL (ref 150–400)
RBC: 4.2 MIL/uL (ref 3.87–5.11)
RDW: 18 % — ABNORMAL HIGH (ref 11.5–15.5)
WBC: 6 10*3/uL (ref 4.0–10.5)
nRBC: 0 % (ref 0.0–0.2)

## 2022-08-15 LAB — RPR: RPR Ser Ql: NONREACTIVE

## 2022-08-15 MED ORDER — LACTATED RINGERS IV SOLN: 500.0000 mL | INTRAVENOUS | Status: DC | PRN

## 2022-08-15 MED ORDER — ONDANSETRON HCL 4 MG/2ML IJ SOLN: 4.0000 mg | Freq: Four times a day (QID) | INTRAMUSCULAR | Status: DC | PRN

## 2022-08-15 MED ORDER — OXYTOCIN-SODIUM CHLORIDE 30-0.9 UT/500ML-% IV SOLN
2.5000 [IU]/h | INTRAVENOUS | Status: DC
  Filled 2022-08-15: qty 500

## 2022-08-15 MED ORDER — SODIUM CHLORIDE 0.9% FLUSH: 3.0000 mL | Freq: Two times a day (BID) | INTRAVENOUS | Status: DC

## 2022-08-15 MED ORDER — SODIUM CHLORIDE 0.9 % IV SOLN: INTRAVENOUS | Status: DC | PRN

## 2022-08-15 MED ORDER — LACTATED RINGERS IV SOLN: INTRAVENOUS | Status: DC

## 2022-08-15 MED ORDER — LIDOCAINE HCL (PF) 1 % IJ SOLN
30.0000 mL | INTRAMUSCULAR | Status: AC | PRN
  Administered 2022-08-15: 30 mL via SUBCUTANEOUS
  Filled 2022-08-15: qty 30

## 2022-08-15 MED ORDER — DIBUCAINE (PERIANAL) 1 % EX OINT: 1.0000 | TOPICAL_OINTMENT | CUTANEOUS | Status: DC | PRN

## 2022-08-15 MED ORDER — PRENATAL MULTIVITAMIN CH
1.0000 | ORAL_TABLET | Freq: Every day | ORAL | Status: DC
  Administered 2022-08-15 – 2022-08-16 (×2): 1 via ORAL
  Filled 2022-08-15 (×2): qty 1

## 2022-08-15 MED ORDER — COCONUT OIL OIL: 1.0000 | TOPICAL_OIL | Status: DC | PRN

## 2022-08-15 MED ORDER — ONDANSETRON HCL 4 MG PO TABS: 4.0000 mg | ORAL_TABLET | ORAL | Status: DC | PRN

## 2022-08-15 MED ORDER — ZOLPIDEM TARTRATE 5 MG PO TABS: 5.0000 mg | ORAL_TABLET | Freq: Every evening | ORAL | Status: DC | PRN

## 2022-08-15 MED ORDER — ACETAMINOPHEN 325 MG PO TABS: 650.0000 mg | ORAL_TABLET | ORAL | Status: DC | PRN

## 2022-08-15 MED ORDER — DIPHENHYDRAMINE HCL 25 MG PO CAPS: 25.0000 mg | ORAL_CAPSULE | Freq: Four times a day (QID) | ORAL | Status: DC | PRN

## 2022-08-15 MED ORDER — OXYCODONE-ACETAMINOPHEN 5-325 MG PO TABS: 2.0000 | ORAL_TABLET | ORAL | Status: DC | PRN

## 2022-08-15 MED ORDER — WITCH HAZEL-GLYCERIN EX PADS: 1.0000 | MEDICATED_PAD | CUTANEOUS | Status: DC | PRN

## 2022-08-15 MED ORDER — TERBUTALINE SULFATE 1 MG/ML IJ SOLN: 0.2500 mg | Freq: Once | INTRAMUSCULAR | Status: DC | PRN

## 2022-08-15 MED ORDER — FLEET ENEMA 7-19 GM/118ML RE ENEM: 1.0000 | ENEMA | Freq: Every day | RECTAL | Status: DC | PRN

## 2022-08-15 MED ORDER — IBUPROFEN 600 MG PO TABS
600.0000 mg | ORAL_TABLET | Freq: Four times a day (QID) | ORAL | Status: DC
  Administered 2022-08-15 – 2022-08-16 (×4): 600 mg via ORAL
  Filled 2022-08-15 (×5): qty 1

## 2022-08-15 MED ORDER — OXYTOCIN-SODIUM CHLORIDE 30-0.9 UT/500ML-% IV SOLN: 1.0000 m[IU]/min | INTRAVENOUS | Status: DC

## 2022-08-15 MED ORDER — SOD CITRATE-CITRIC ACID 500-334 MG/5ML PO SOLN: 30.0000 mL | ORAL | Status: DC | PRN

## 2022-08-15 MED ORDER — SENNOSIDES-DOCUSATE SODIUM 8.6-50 MG PO TABS
2.0000 | ORAL_TABLET | ORAL | Status: DC
  Administered 2022-08-15 – 2022-08-16 (×2): 2 via ORAL
  Filled 2022-08-15 (×2): qty 2

## 2022-08-15 MED ORDER — OXYTOCIN-SODIUM CHLORIDE 30-0.9 UT/500ML-% IV SOLN
1.0000 m[IU]/min | INTRAVENOUS | Status: DC
  Administered 2022-08-15: 2 m[IU]/min via INTRAVENOUS

## 2022-08-15 MED ORDER — MISOPROSTOL 50MCG HALF TABLET
50.0000 ug | ORAL_TABLET | Freq: Once | ORAL | Status: DC
  Filled 2022-08-15: qty 1

## 2022-08-15 MED ORDER — OXYCODONE-ACETAMINOPHEN 5-325 MG PO TABS: 1.0000 | ORAL_TABLET | ORAL | Status: DC | PRN

## 2022-08-15 MED ORDER — BENZOCAINE-MENTHOL 20-0.5 % EX AERO
1.0000 | INHALATION_SPRAY | CUTANEOUS | Status: DC | PRN
  Administered 2022-08-15: 1 via TOPICAL
  Filled 2022-08-15 (×2): qty 56

## 2022-08-15 MED ORDER — SODIUM CHLORIDE 0.9% FLUSH: 3.0000 mL | INTRAVENOUS | Status: DC | PRN

## 2022-08-15 MED ORDER — FENTANYL CITRATE (PF) 100 MCG/2ML IJ SOLN
50.0000 ug | INTRAMUSCULAR | Status: DC | PRN
  Administered 2022-08-15: 100 ug via INTRAVENOUS
  Filled 2022-08-15: qty 2

## 2022-08-15 MED ORDER — HYDROXYZINE HCL 50 MG PO TABS: 50.0000 mg | ORAL_TABLET | Freq: Four times a day (QID) | ORAL | Status: DC | PRN

## 2022-08-15 MED ORDER — SIMETHICONE 80 MG PO CHEW: 80.0000 mg | CHEWABLE_TABLET | ORAL | Status: DC | PRN

## 2022-08-15 MED ORDER — MISOPROSTOL 25 MCG QUARTER TABLET
25.0000 ug | ORAL_TABLET | Freq: Once | ORAL | Status: DC
  Filled 2022-08-15: qty 1

## 2022-08-15 MED ORDER — ONDANSETRON HCL 4 MG/2ML IJ SOLN: 4.0000 mg | INTRAMUSCULAR | Status: DC | PRN

## 2022-08-15 MED ORDER — OXYTOCIN BOLUS FROM INFUSION
333.0000 mL | Freq: Once | INTRAVENOUS | Status: AC
  Administered 2022-08-15: 333 mL via INTRAVENOUS

## 2022-08-15 NOTE — Discharge Summary (Signed)
Postpartum Discharge Summary    Patient Name: Kara Monroe DOB: 08/03/1990 MRN: [id number]  Date of admission: 08/15/2022 Delivery date:08/15/2022  Delivering provider: Alfredia Ferguson  Date of discharge: 08/16/2022  Admitting diagnosis: Post term pregnancy [O48.0] Intrauterine pregnancy: [redacted]w[redacted]d     Secondary diagnosis:  Principal Problem:   Vaginal delivery Active Problems:   Supervision of other normal pregnancy, antepartum   Language barrier affecting health care   Post term pregnancy  Additional problems: n/a    Discharge diagnosis: Term Pregnancy Delivered                                              Post partum procedures: none Augmentation: N/A Complications: None  Hospital course: Induction of Labor With Vaginal Delivery   32 y.o. yo [id number] at [redacted]w[redacted]d was admitted to the hospital 08/15/2022 for induction of labor.  Indication for induction: Postdates.  Patient had an labor course complicated by: none. Membrane Rupture Time/Date: 5:55 AM ,08/15/2022   Delivery Method:Vaginal, Spontaneous  Episiotomy: None  Lacerations:  1st degree;Perineal  Details of delivery can be found in separate delivery note.  Patient had a postpartum course complicated by none. Patient is discharged home 08/16/22.  Newborn Data: Birth date:08/15/2022  Birth time:6:00 AM  Gender:Female  Living status:Living  Apgars:8 ,9  Weight:3370 g   Magnesium Sulfate received: No BMZ received: No Rhophylac:N/A MMR:N/A T-DaP:Given prenatally Flu: N/A Transfusion:No  Physical exam  Vitals:   08/15/22 1340 08/15/22 1816 08/15/22 2157 08/16/22 0632  BP: 94/60 (!) 89/63 96/64 93/65   Pulse: 78 84 73 75  Resp: 16 16 17 18   Temp: 98.2 F (36.8 C) 98 F (36.7 C) 98.2 F (36.8 C) 97.9 F (36.6 C)  TempSrc: Oral Oral Oral   SpO2: 100% 100% 100% 100%  Weight:      Height:       General: alert, cooperative, and no distress Lochia: appropriate Uterine Fundus: firm Incision: N/A DVT  Evaluation: No evidence of DVT seen on physical exam. Labs: Lab Results  Component Value Date   WBC 6.0 08/15/2022   HGB 10.0 (L) 08/15/2022   HCT 32.0 (L) 08/15/2022   MCV 76.2 (L) 08/15/2022   PLT 217 08/15/2022      Latest Ref Rng & Units 10/04/2019   12:20 PM  CMP  Glucose 65 - 99 mg/dL 73   BUN 6 - 20 mg/dL 5   Creatinine 1.47 - 8.29 mg/dL 5.62   Sodium 130 - 865 mmol/L 138   Potassium 3.5 - 5.2 mmol/L 4.0   Chloride 96 - 106 mmol/L 106   CO2 20 - 29 mmol/L 19   Calcium 8.7 - 10.2 mg/dL 9.3   Total Protein 6.0 - 8.5 g/dL 6.8   Total Bilirubin 0.0 - 1.2 mg/dL 0.5   Alkaline Phos 48 - 121 IU/L 59   AST 0 - 40 IU/L 27   ALT 0 - 32 IU/L 21    Edinburgh Score:    08/15/2022    6:47 PM  Edinburgh Postnatal Depression Scale Screening Tool  I have been able to laugh and see the funny side of things. 0  I have looked forward with enjoyment to things. 0  I have blamed myself unnecessarily when things went wrong. 1  I have been anxious or worried for no good reason. 0  I have felt scared or panicky for  no good reason. 0  Things have been getting on top of me. 0  I have been so unhappy that I have had difficulty sleeping. 0  I have felt sad or miserable. 0  I have been so unhappy that I have been crying. 0  The thought of harming myself has occurred to me. 0  Edinburgh Postnatal Depression Scale Total 1     After visit meds:  Allergies as of 08/16/2022   No Known Allergies      Medication List     STOP taking these medications    prenatal vitamin w/FE, FA 27-1 MG Tabs tablet       TAKE these medications    acetaminophen 325 MG tablet Commonly known as: Tylenol Take 2 tablets (650 mg total) by mouth every 4 (four) hours as needed (for pain scale < 4).   benzocaine-Menthol 20-0.5 % Aero Commonly known as: DERMOPLAST Apply 1 Application topically as needed for irritation (perineal discomfort).   ferrous sulfate 325 (65 FE) MG EC tablet Take 1 tablet (325  mg total) by mouth every other day.   ibuprofen 600 MG tablet Commonly known as: ADVIL Take 1 tablet (600 mg total) by mouth every 6 (six) hours.   senna-docusate 8.6-50 MG tablet Commonly known as: Senokot-S Take 2 tablets by mouth daily. Start taking on: August 17, 2022         Discharge home in stable condition Infant Feeding: Bottle and Breast Infant Disposition:home with mother Discharge instruction: per After Visit Summary and Postpartum booklet. Activity: Advance as tolerated. Pelvic rest for 6 weeks.  Diet: routine diet Future Appointments:No future appointments. Follow up Visit:  The following message was sent to [facility]  Please schedule this patient for a In person postpartum visit in 6 weeks with the following provider: Any provider. Additional Postpartum F/U: none   Low risk pregnancy complicated by:  n/a Delivery mode:  Vaginal, Spontaneous  Anticipated Birth Control:  Unsure   08/16/2022 Myrtie Hawk, DO

## 2022-08-15 NOTE — H&P (Signed)
OBSTETRIC ADMISSION HISTORY AND PHYSICAL  Kara Monroe is a 32 y.o. female [phone] with IUP at [redacted]w[redacted]d by LMP presenting for scheduled post dates IOL. She reports +FMs, No LOF, no VB, no blurry vision, headaches or peripheral edema, and RUQ pain. She plans on breast and bottle feeding. She is undecided for birth control. She received her prenatal care at [facility]   Dating: By LMP --->  Estimated Date of Delivery: 08/07/22  Sono:    @[redacted]w[redacted]d , CWD, normal anatomy, transverse presentation, 799g, 20% EFW   Prenatal History/Complications:  Anemia of pregnancy Language barrier        Nursing Staff Provider  Office Location [facility] Dating  08/07/2022, by Last Menstrual Period  [facility] Model [X]  Traditional [ ]  Centering [ ]  Mom-Baby Dyad      Language  Kinyarwanda Anatomy US  WNL  Flu Vaccine  declined Genetic/Carrier Screen  NIPS:   low risk female AFP:   Negative Horizon: Neg x 4  TDaP Vaccine   05/24/22 Hgb A1C or  GTT Early  Third trimester: normal  COVID Vaccine 2- doses-Pfizer   LAB RESULTS   Rhogam  O/Positive/-- (01/03 1135)  Blood Type O/Positive/-- (01/03 1135)   Baby Feeding Plan Both Antibody Negative (01/03 1135)  Contraception Undecided Rubella 17.20 (01/03 1135)  Circumcision Undecided RPR Non Reactive (04/05 0848)   Pediatrician  [place] HBsAg Negative (01/03 1135)   Support Person Donat(FOB) HCVAb Non Reactive (01/03 1135)   Prenatal Classes   HIV Non Reactive (04/05 0848)     BTL Consent NA GBS Negative/-- (05/23 1201)(For PCN allergy, check sensitivities)   VBAC Consent NA Pap       Diagnosis  Date Value Ref Range Status  10/04/2019     Final    - Negative for intraepithelial lesion or malignancy (NILM)             DME Rx [ x] BP cuff [ ]  Weight Scale Waterbirth  [ ]  Class [ ]  Consent [ ]  CNM visit  PHQ9 & GAD7 [  ] new OB [  ] 28 weeks  [  ] 36 weeks Induction  [ ]  Orders Entered [ ] Foley Y/N     Past Medical History: Past Medical History:   Diagnosis Date   Acute blood loss anemia 11/18/2020   Hgb 10.1 on 9/26. Started on FeSO4 every other day.    Complication of anesthesia    took 2 days for right leg to wake up   Medical history non-contributory    Refugee health examination 08/31/2019   DRC -> [place]  [place] date was 04/27/19 Normal overall     Past Surgical History: Past Surgical History:  Procedure Laterality Date   NO PAST SURGERIES      Obstetrical History: OB History     Gravida  5   Para  3   Term  3   Preterm  0   AB  1   Living  3      SAB  1   IAB  0   Ectopic  0   Multiple  0   Live Births  3        Obstetric Comments            Social History Social History   Socioeconomic History   Marital status: Married    Spouse name: Not on file   Number of children: Not on file   Years of education: Not on file  Highest education level: Not on file  Occupational History   Not on file  Tobacco Use   Smoking status: Never   Smokeless tobacco: Never  Vaping Use   Vaping Use: Never used  Substance and Sexual Activity   Alcohol use: Never   Drug use: Never   Sexual activity: Yes    Partners: Male    Birth control/protection: None  Other Topics Concern   Not on file  Social History Narrative   ** Merged History Encounter **       Lived in [place]  Husband is Donat Mboneza Son- Fast  (2013) Daughter- Faith (2014)  Prior occupation of Training and development officer Information Number of Immediate Family Members: 4 Number of Immediate Family Members in [place]: 4 Date of Arriv   al: 08/12/19 Country of Birth: [facility] Country of Origin:  ([place]) Location of Refugee Camp:  ([place]) Duration in [place]: 16-19 years Reason for Leaving Home Country: Oceanographer opinion Primary Language: Kinyarwanda/Rwanda, [place], Swahili/Kiswahili, En   glish Able to Read in Primary Language: Yes Able to Write in Primary Language: Yes (Swahili and [place]) Education: [employer] Mar[employer]  Status: Married Sexual Activity: Yes Tuberculosis Screening Overseas: Negative Tuberculosis Screening Health Dep   artment: Not Completed Health Department Labs Completed: No History of Trauma: None Do You Feel Jumpy or Nervous?: No Are You Very Watchful or 'Super Alert'?: No    Social Determinants of Health   Financial Resource Strain: Not on file  Food Insecurity: No Food Insecurity (08/15/2022)   Hunger Vital Sign    Worried About Running Out of Food in the Last Year: Never true    Ran Out of Food in the Last Year: Never true  Transportation Needs: No Transportation Needs (08/15/2022)   PRAPARE - Administrator, Civil Service (Medical): No    Lack of Transportation (Non-Medical): No  Physical Activity: Not on file  Stress: Not on file  Social Connections: Not on file    Family History: Family History  Problem Relation Age of Onset   Healthy Mother    Hypertension Father    Heart disease Father     Allergies: No Known Allergies  Medications Prior to Admission  Medication Sig Dispense Refill Last Dose   ferrous sulfate 325 (65 FE) MG EC tablet Take 1 tablet (325 mg total) by mouth every other day. 45 tablet 1    prenatal vitamin w/FE, FA (PRENATAL 1 + 1) 27-1 MG TABS tablet Take 1 tablet by mouth daily at 12 noon. 30 tablet 11      Review of Systems   All systems reviewed and negative except as stated in HPI  Blood pressure (!) 85/58, pulse 82, temperature 98.2 F (36.8 C), temperature source Oral, resp. rate 16, height 5' (1.524 m), weight 68.3 kg, last menstrual period 10/31/2021, unknown if currently breastfeeding.  General appearance: alert, cooperative, and appears stated age Lungs: clear to auscultation bilaterally Heart: regular rate and rhythm Abdomen: soft, non-tender; bowel sounds normal Pelvic: see below Extremities: Homans sign is negative, no sign of DVT  Presentation: cephalic  Fetal monitoring: 135 bpm, moderate variability, +  accelerations, no decelerations  Uterine activity: mild contractions every 7-10 mins Dilation: 3 Effacement (%): 70 Station: -2 Exam by:: Esther Hardy RN  Prenatal labs: ABO, Rh: --/--/PENDING (06/27 0146) Antibody: PENDING (06/27 0146) Rubella: 17.20 (01/03 1135) RPR: Non Reactive (04/05 0848)  HBsAg: Negative (01/03 1135)  HIV: Non Reactive (04/05 0848)  GBS: Negative/-- (05/23  1201)  2 hr Glucola normal Genetic screening  normal Anatomy US normal  Prenatal Transfer Tool  Maternal Diabetes: No Genetic Screening: Normal Maternal Ultrasounds/Referrals: Normal Fetal Ultrasounds or other Referrals:  None Maternal Substance Abuse:  No Significant Maternal Medications:  None Significant Maternal Lab Results:  Group B Strep negative Number of Prenatal Visits:greater than 3 verified prenatal visits Other Comments:  None  Results for orders placed or performed during the hospital encounter of 08/15/22 (from the past 24 hour(s))  CBC   Collection Time: 08/15/22  1:30 AM  Result Value Ref Range   WBC 6.0 4.0 - 10.5 K/uL   RBC 4.20 3.87 - 5.11 MIL/uL   Hemoglobin 10.0 (L) 12.0 - 15.0 g/dL   HCT 82.9 (L) 56.2 - 13.0 %   MCV 76.2 (L) 80.0 - 100.0 fL   MCH 23.8 (L) 26.0 - 34.0 pg   MCHC 31.3 30.0 - 36.0 g/dL   RDW 86.5 (H) 78.4 - 69.6 %   Platelets 217 150 - 400 K/uL   nRBC 0.0 0.0 - 0.2 %  Type and screen   Collection Time: 08/15/22  1:46 AM  Result Value Ref Range   ABO/RH(D) PENDING    Antibody Screen PENDING    Sample Expiration      08/18/2022,2359 Performed at [facility], [address], [place], [place] [address]     Patient Active Problem List   Diagnosis Date Noted   Post term pregnancy 08/15/2022   Language barrier affecting health care 04/05/2022   Supervision of other normal pregnancy, antepartum 02/20/2022    Assessment/Plan:  Kara Monroe is a 32 y.o. [id number] at [redacted]w[redacted]d here for PD IOL  #Labor:admit to L&D, plan to start IOL with  oxytocin, given bishops score of 8. #Pain: Family support, IV fentanyl, epidural when ready  #FWB: Cat 1 #ID:  GBS neg #MOF: both #MOC: undecided #Circ:  undecided  Sheppard Evens MD MPH OB Fellow, Faculty Practice [facility], Center for [facility] 08/15/2022

## 2022-08-15 NOTE — Lactation Note (Signed)
This note was copied from a baby's chart. Lactation Consultation Note  Patient Name: Kara Monroe [id number] Date: 08/15/2022 Age:34 hours Reason for consult: Initial assessment;Term.  [facility] used Kinyarwanda interpreter # [id number] [phone] until Birth Parent request her Sibling to interpret who was in the room with LC. Form given for Family member to finished the consult. Per Birth Parent ,infant has been sleepy not latching well at the breast, LC explain infant is adjusting to continue to work towards latching infant at the breast and ask for further latch assistance if needed. Birth Parent is experienced with breastfeeding see maternal data below. Birth Parent attempted to latch infant on her left breast using the football hold, but infant was sleepy only held nipple in his mouth. Birth Parent already knows how to hand express with previous children and she easily self expressed 8 mls of colostrum that was spoon fed to infant.  LC discussed infant's input and output, Per Birth Parent, infant had 7 voids and one stool since birth. LC discussed importance of maternal rest, diet and hydration. Birth Parent was  made aware of O/P services, breastfeeding support groups, community resources, and our phone # for post-discharge questions.    Birth Parent would like hand pump prior to discharge for PRN, home use.  Current feeding plan: 1- Birth Parent will continue to work on latching infant at the breast, according to hunger cues, on demand, 8 to 12 + times, skin to skin. 2- Birth Parent knows if infant doesn't latch to hand express and give infant back colostrum by spoon. 3- Birth Parent knows to call RN/LC for further latch assistance if needed.   Maternal Data Has patient been taught Hand Expression?: Yes Does the patient have breastfeeding experience prior to this delivery?: Yes How long did the patient breastfeed?: Birth Parent, BF 1st child for 14 months, 2nd child for 3 years and 3rd child who  is 19 months for 11 months.  Feeding Mother's Current Feeding Choice: Breast Milk  LATCH Score Latch: Too sleepy or reluctant, no latch achieved, no sucking elicited.  Audible Swallowing: None  Type of Nipple: Everted at rest and after stimulation  Comfort (Breast/Nipple): Soft / non-tender  Hold (Positioning): Assistance needed to correctly position infant at breast and maintain latch.  LATCH Score: 5   Lactation Tools Discussed/Used    Interventions Interventions: Breast feeding basics reviewed;Adjust position;Assisted with latch;Support pillows;Skin to skin;Position options;Education;Breast compression;LC Services brochure;Hand express;Breast massage  Discharge    Consult Status Consult Status: Follow-up Date: 08/16/22 Follow-up type: In-patient    Frederico Hamman 08/15/2022, 6:02 PM

## 2022-08-15 NOTE — Progress Notes (Signed)
Vital signs taken by Woodroe Chen RN.

## 2022-08-16 ENCOUNTER — Other Ambulatory Visit (HOSPITAL_COMMUNITY): Payer: Self-pay

## 2022-08-16 LAB — BIRTH TISSUE RECOVERY COLLECTION (PLACENTA DONATION)

## 2022-08-16 MED ORDER — BENZOCAINE-MENTHOL 20-0.5 % EX AERO
1.0000 | INHALATION_SPRAY | CUTANEOUS | 0 refills | Status: DC | PRN
  Filled 2022-08-16: qty 78, fill #0

## 2022-08-16 MED ORDER — ACETAMINOPHEN 325 MG PO TABS
650.0000 mg | ORAL_TABLET | ORAL | 0 refills | Status: DC | PRN
  Filled 2022-08-16: qty 100, 9d supply, fill #0

## 2022-08-16 MED ORDER — IBUPROFEN 600 MG PO TABS
600.0000 mg | ORAL_TABLET | Freq: Four times a day (QID) | ORAL | 0 refills | Status: DC
  Filled 2022-08-16: qty 30, 8d supply, fill #0

## 2022-08-16 MED ORDER — SENNOSIDES-DOCUSATE SODIUM 8.6-50 MG PO TABS
2.0000 | ORAL_TABLET | ORAL | 0 refills | Status: DC
  Filled 2022-08-16: qty 30, 15d supply, fill #0

## 2022-08-16 NOTE — Social Work (Signed)
CSW received a consult stating "Mother needs help with transportation to appointment and pack and play". CSW arranged MOB's transport with medicaid for infants appointment at 11:30am on 7/1. Transportation confirmation number is [id number]. CSW notified MOB and provided her with the number for any follow up questions. CSW inquired about a place for the infant to sleep, MOB reported they needed a pack n play, CSW provided a pack n play for the infant to sleep. CSW inquired if there were any other needs. MOB reported none.   CSW identifies no further need for intervention and no barriers to discharge at this time  Wende Neighbors, [facility] Clinical Social Worker [phone]

## 2022-09-09 ENCOUNTER — Telehealth (HOSPITAL_COMMUNITY): Payer: Self-pay | Admitting: *Deleted

## 2022-09-09 NOTE — Telephone Encounter (Signed)
09/09/2022  Name: Kara Monroe MRN: [id number] DOB: July 30, 1990  Reason for Call:  Transition of Care Hospital Discharge Call  Contact Status: Patient Contact Status: Complete  Language assistant needed: Interpreter Mode: Telephonic Interpreter Interpreter Name: Tristan Schroeder [phone] Interpreter Phone Number - If applicable: language line [phone]        Follow-Up Questions: Do You Have Any Concerns About Your Health As You Heal From Delivery?: No Do You Have Any Concerns About Your Infants Health?: Yes What Concerns Do You Have About Your Baby?: "He has something, a rash, on his neck." Patient denied infant having a fever or being irritable. RN instructed patient to observe and to call pediatrician if no improvement within 24hours. Patient verbalized understanding. No other questions or concerns voiced at this time.  Edinburgh Postnatal Depression Scale:  In the Past 7 Days: I have been able to laugh and see the funny side of things.: As much as I always could I have looked forward with enjoyment to things.: As much as I ever did I have blamed myself unnecessarily when things went wrong.: No, never I have been anxious or worried for no good reason.: No, not at all I have felt scared or panicky for no good reason.: No, not at all Things have been getting on top of me.: No, I have been coping as well as ever I have been so unhappy that I have had difficulty sleeping.: Not at all I have felt sad or miserable.: No, not at all I have been so unhappy that I have been crying.: No, never The thought of harming myself has occurred to me.: Never Edinburgh Postnatal Depression Scale Total: 0  PHQ2-9 Depression Scale:     Discharge Follow-up: Edinburgh score requires follow up?: No  Post-discharge interventions: Reviewed Newborn Safe Sleep Practices  Signature Deforest Hoyles, RN, 09/09/22, [phone]

## 2022-10-04 NOTE — Progress Notes (Unsigned)
  [facility] Postpartum Visit  The patient speaks [name] as their primary language.  An interpreter was used for the entire visit.   Kara Monroe is a 32 y.o. [id number] presenting for a postpartum visit.  She has the following concerns today: desires contraception She delivered via vaginal delivery at 41 weeks.  She reports her vaginal bleeding is absent. She is breast/formula feeding her infant. She feels she is bonding well. She is considering Depo vs. Nexplanon vs. IUD for contraception.   Edinburgh Postnatal Depression Scale: Not given due to language (10 or higher is positive)  Reviewed pregnancy and delivery course.  -  Vitals:   10/08/22 1018  BP: 108/79  Pulse: 82  SpO2: 98%   Exam: RRR Lungs clear   PROCEDURE NOTE: Nexplanon insertion Patient given informed consent, signed copy in the chart.  Appropriate time out taken  Pregnancy test was negative.  The patient's  LEFT arm was prepped and draped in the usual sterile fashion.. The ruler used to measure and mark the insertion area 8 cm from medial epicondyle of the elbow. Local anaesthesia obtained using 2 cc of 1% lidocaine with epinephrine. Nexplanon was inserted per manufacturer's directions. Less than 1 cc blood loss. The insertion site covered with antibiotic ointment and a pressure bandage to minimize bruising. There were no complications and the patient tolerated the procedure well.  Device information was given in handout form. Patient is informed the removal date will be in three years and package insert card filled out and given to her.   A/P:  Postpartum visit: patient is 8 weeks postpartum following a vaginal delivery. -Discussed patients delivery and complications -Patient has urinary incontinence? No Patient was referred to pelvic floor PT  -Patient is safe to resume physical and sexual activity -Patient does not want a pregnancy in the next year. -Reviewed forms of contraception in  tiered fashion. Patient desired  Nexplanon  today.   -Return to sexual activity and contraception discussed as above.  -Discussed birth spacing of 18 months -Breastfeeding: Yes, provided support of decision and resources as indicated  -Mood: doing well--Edinburgh not given   -Discussed sleep and fatigue management and encouraged family/community support.  -Need for postpartum diabetes screening:  NA (2 hour glucose tolerance testing between 6-12 weeks postpartum)  - Discussed and recommended CBC and ferritin--she will return for this  -Reviewed prior Pap and she is next due for Pap next year.

## 2022-10-08 ENCOUNTER — Encounter: Payer: Self-pay | Admitting: Family Medicine

## 2022-10-08 ENCOUNTER — Other Ambulatory Visit: Payer: Self-pay

## 2022-10-08 ENCOUNTER — Ambulatory Visit (INDEPENDENT_AMBULATORY_CARE_PROVIDER_SITE_OTHER): Payer: Medicaid Other | Admitting: Family Medicine

## 2022-10-08 DIAGNOSIS — Z30017 Encounter for initial prescription of implantable subdermal contraceptive: Secondary | ICD-10-CM

## 2022-10-08 DIAGNOSIS — Z3202 Encounter for pregnancy test, result negative: Secondary | ICD-10-CM | POA: Diagnosis not present

## 2022-10-08 DIAGNOSIS — Z3046 Encounter for surveillance of implantable subdermal contraceptive: Secondary | ICD-10-CM | POA: Diagnosis present

## 2022-10-08 DIAGNOSIS — D509 Iron deficiency anemia, unspecified: Secondary | ICD-10-CM

## 2022-10-08 LAB — POCT URINE PREGNANCY: Preg Test, Ur: NEGATIVE

## 2022-10-08 NOTE — Patient Instructions (Signed)
It was wonderful to see you today.  Please bring ALL of your medications with you to every visit.   Today we talked about:  Getting a nexplanon Checking blood work   Patient Information: after Nexplanon insertion. Today you received a NEXPLANON insertion for contraception. Please remember: you should use a back up method of contraception for 7 days after insertion as the Nexplanon is not 100% effective until it has been present 1 week Also remember that nexplanon does not protect you from sexually transmitted infections (STI) so additional protection fbarrier or STI such as condoms should be considered in conjunction with the implant. Leave the bandage on for 24 hours Do not pick at the site You can shower tomorrow   Please follow up in 3 months   Thank you for choosing [facility].   Please call [phone] with any questions about today's appointment.  Please be sure to schedule follow up at the front  desk before you leave today.   Terisa Starr, MD  Family Medicine

## 2022-10-10 MED ORDER — ETONOGESTREL 68 MG ~~LOC~~ IMPL
68.0000 mg | DRUG_IMPLANT | Freq: Once | SUBCUTANEOUS | Status: AC
Start: 2022-10-10 — End: 2022-10-08
  Administered 2022-10-08: 68 mg via SUBCUTANEOUS

## 2022-10-10 NOTE — Addendum Note (Signed)
Addended by: Veronda Prude on: 10/10/2022 03:36 PM   Modules accepted: Orders

## 2022-10-14 ENCOUNTER — Ambulatory Visit: Payer: Medicaid Other | Admitting: Family Medicine

## 2023-09-18 ENCOUNTER — Encounter: Payer: Self-pay | Admitting: Family Medicine
